# Patient Record
Sex: Female | Born: 1985 | Race: White | Hispanic: No | Marital: Single | Smoking: Current every day smoker
Health system: Southern US, Community
[De-identification: ages and names within clinical notes are randomized; demographics above are authoritative.]

## PROBLEM LIST (undated history)

## (undated) DIAGNOSIS — N189 Chronic kidney disease, unspecified: Secondary | ICD-10-CM

## (undated) DIAGNOSIS — K219 Gastro-esophageal reflux disease without esophagitis: Secondary | ICD-10-CM

## (undated) DIAGNOSIS — C801 Malignant (primary) neoplasm, unspecified: Secondary | ICD-10-CM

## (undated) HISTORY — PX: OTHER SURGICAL HISTORY: SHX169

---

## 2005-06-11 ENCOUNTER — Inpatient Hospital Stay (HOSPITAL_COMMUNITY): Admission: EM | Admit: 2005-06-11 | Discharge: 2005-06-13 | Payer: Self-pay | Admitting: Orthopedic Surgery

## 2005-10-05 ENCOUNTER — Emergency Department (HOSPITAL_COMMUNITY): Admission: EM | Admit: 2005-10-05 | Discharge: 2005-10-05 | Payer: Self-pay | Admitting: Emergency Medicine

## 2005-11-20 ENCOUNTER — Emergency Department (HOSPITAL_COMMUNITY): Admission: EM | Admit: 2005-11-20 | Discharge: 2005-11-20 | Payer: Self-pay | Admitting: Emergency Medicine

## 2010-10-14 ENCOUNTER — Inpatient Hospital Stay (HOSPITAL_COMMUNITY)
Admission: AD | Admit: 2010-10-14 | Discharge: 2010-10-14 | Disposition: A | Discharge status: Home / Self Care | Payer: BC Managed Care – PPO | Source: Ambulatory Visit | Attending: Obstetrics & Gynecology | Admitting: Obstetrics & Gynecology

## 2010-10-14 ENCOUNTER — Emergency Department (HOSPITAL_COMMUNITY)
Admission: EM | Admit: 2010-10-14 | Discharge: 2010-10-14 | Disposition: A | Discharge status: Home / Self Care | Payer: BC Managed Care – PPO | Attending: Emergency Medicine | Admitting: Emergency Medicine

## 2010-10-14 ENCOUNTER — Encounter: Payer: Self-pay | Admitting: Emergency Medicine

## 2010-10-14 ENCOUNTER — Emergency Department (HOSPITAL_COMMUNITY): Payer: BC Managed Care – PPO

## 2010-10-14 ENCOUNTER — Other Ambulatory Visit: Payer: Self-pay

## 2010-10-14 ENCOUNTER — Encounter (HOSPITAL_COMMUNITY): Payer: Self-pay

## 2010-10-14 DIAGNOSIS — O99891 Other specified diseases and conditions complicating pregnancy: Secondary | ICD-10-CM | POA: Insufficient documentation

## 2010-10-14 DIAGNOSIS — Z331 Pregnant state, incidental: Secondary | ICD-10-CM

## 2010-10-14 DIAGNOSIS — R0789 Other chest pain: Secondary | ICD-10-CM | POA: Insufficient documentation

## 2010-10-14 DIAGNOSIS — M549 Dorsalgia, unspecified: Secondary | ICD-10-CM | POA: Insufficient documentation

## 2010-10-14 DIAGNOSIS — R091 Pleurisy: Secondary | ICD-10-CM | POA: Insufficient documentation

## 2010-10-14 LAB — PREGNANCY, URINE: Preg Test, Ur: POSITIVE

## 2010-10-14 NOTE — Progress Notes (Signed)
Pt was seen at [facility] earlier today for back pain. Had a POS UPT and a BHCG of 73 and was told to come to MAU for results. Pt has a history of endometrosis and has had 2 SAB's in the past. Pt is not having any abdominal pain or bleeding.

## 2010-10-14 NOTE — ED Provider Notes (Signed)
History     No chief complaint on file.  HPIReanna C Powell25 y.o.presents for test results that were drawn at [facility].  G3 P0020.  Hx endometriosis and 2 miscarriages in first trimester.  LMP 10/07/10 that she reports as normal.  Is sexually active without contraception because she thought she was unable to become pregnant because hx endometriosis.  Denies vaginal bleeding.  Presented to [facility] for back pain. Had xray of her back that was normal.  Told she had pulled muscle.  Told she was pregnant and told to come here today for an ultrasound and to get blood test results because the  Computer was down.  She is a patient of [facility] in [place].  She plans followup there.      Past Medical History  Diagnosis Date  . Endometriosis   . Endometriosis     Past Surgical History  Procedure Date  . Laporscopic     r/o endometriosis    No family history on file.  History  Substance Use Topics  . Smoking status: Current Everyday Smoker -- 1.0 packs/day  . Smokeless tobacco: Not on file  . Alcohol Use: No    Allergies: No Known Allergies  Prescriptions prior to admission  Medication Sig Dispense Refill  . ibuprofen (ADVIL,MOTRIN) 200 MG tablet Take 800 mg by mouth every 6 (six) hours as needed. Pain         Review of Systems  Constitutional: Negative.   Gastrointestinal: Negative for nausea, vomiting and abdominal pain.  Genitourinary: Negative.   Musculoskeletal: Positive for back pain.   Physical Exam   Blood pressure 135/81, pulse 84, temperature 98.6 F (37 C), temperature source Oral, resp. rate 20, height 5' 2.5" (1.588 m), weight 206 lb 3.2 oz (93.532 kg), last menstrual period 10/03/2010.  Physical Exam  Constitutional: She appears well-developed and well-nourished. No distress.  additional examination not indicated.  Results for orders placed during the hospital encounter of 10/14/10 (from the past 24 hour(s))  PREGNANCY, URINE     Status: Normal   Collection Time   10/14/10  9:54 AM      Component Value Range   Preg Test, Ur POSITIVE    HCG, QUANTITATIVE, PREGNANCY     Status: Abnormal   Collection Time   10/14/10 11:22 AM      Component Value Range   hCG, Beta Chain, Quant, S 73 (*) <5 (mIU/mL)    MAU Course  Procedures  MDM Discussed with Dr. Marice Potter patient's hx, visit to [facility] today, lab results and plans to follow up with Dr. Francee Piccolo.  She agree with this plan.  Assessment and Plan  A: Early pregnancy  P follow up with Dr. Francee Piccolo in 48 hrs.    KEY,EVE M 10/14/2010, 4:54 PM   [name], NP 10/14/10 1719

## 2010-10-14 NOTE — ED Notes (Signed)
Pt c/o mid chest pain and upper back pain starting yesterday. Pain worsens with inspirations. Denies cough, fever. Reports family history of cardiac problems.  Patient placed on continuous cardiac monitoring, continuous pulse oximetry, and NBP cycling q 30 minutes.  EKG completed and shown to Dr. Radford Pax at 0830.  Dr. Fara Boros in to see patient.  NAD noted at this time.

## 2010-10-14 NOTE — ED Notes (Signed)
Dr. Fara Boros in to speak with pt about results. Dr. Fara Boros wants to order another blood test before discharging patient. Patient aware and is agreeable.

## 2010-10-14 NOTE — ED Notes (Signed)
Patient with no complaints at this time. Respirations even and unlabored. Skin warm/dry. Discharge instructions reviewed with patient at this time. Patient given opportunity to voice concerns/ask questions. Patient discharged at this time and left Emergency Department with steady gait.  See paper chart for further documentation.

## 2013-01-16 ENCOUNTER — Emergency Department (HOSPITAL_COMMUNITY): Payer: Self-pay

## 2013-01-16 ENCOUNTER — Other Ambulatory Visit: Payer: Self-pay

## 2013-01-16 ENCOUNTER — Encounter (HOSPITAL_COMMUNITY): Payer: Self-pay | Admitting: Emergency Medicine

## 2013-01-16 ENCOUNTER — Emergency Department (HOSPITAL_COMMUNITY)
Admission: EM | Admit: 2013-01-16 | Discharge: 2013-01-16 | Disposition: A | Discharge status: Home / Self Care | Payer: Self-pay | Attending: Emergency Medicine | Admitting: Emergency Medicine

## 2013-01-16 DIAGNOSIS — K8020 Calculus of gallbladder without cholecystitis without obstruction: Secondary | ICD-10-CM | POA: Insufficient documentation

## 2013-01-16 DIAGNOSIS — Z9889 Other specified postprocedural states: Secondary | ICD-10-CM | POA: Insufficient documentation

## 2013-01-16 DIAGNOSIS — F17200 Nicotine dependence, unspecified, uncomplicated: Secondary | ICD-10-CM | POA: Insufficient documentation

## 2013-01-16 DIAGNOSIS — Z3202 Encounter for pregnancy test, result negative: Secondary | ICD-10-CM | POA: Insufficient documentation

## 2013-01-16 DIAGNOSIS — Z8742 Personal history of other diseases of the female genital tract: Secondary | ICD-10-CM | POA: Insufficient documentation

## 2013-01-16 LAB — CBC WITH DIFFERENTIAL/PLATELET
Basophils Absolute: 0 10*3/uL (ref 0.0–0.1)
Basophils Relative: 0 % (ref 0–1)
Eosinophils Absolute: 0.4 10*3/uL (ref 0.0–0.7)
Hemoglobin: 14.6 g/dL (ref 12.0–15.0)
MCH: 31 pg (ref 26.0–34.0)
MCHC: 33.9 g/dL (ref 30.0–36.0)
Monocytes Relative: 6 % (ref 3–12)
Neutrophils Relative %: 64 % (ref 43–77)
RDW: 12.4 % (ref 11.5–15.5)

## 2013-01-16 LAB — BASIC METABOLIC PANEL
BUN: 11 mg/dL (ref 6–23)
Creatinine, Ser: 0.68 mg/dL (ref 0.50–1.10)
GFR calc Af Amer: >90 mL/min (ref >90)
GFR calc non Af Amer: >90 mL/min (ref >90)
Potassium: 4.2 mEq/L (ref 3.5–5.1)

## 2013-01-16 LAB — HEPATIC FUNCTION PANEL
Alkaline Phosphatase: 83 U/L (ref 39–117)
Total Bilirubin: 0.2 mg/dL — ABNORMAL LOW (ref 0.3–1.2)

## 2013-01-16 LAB — URINALYSIS, ROUTINE W REFLEX MICROSCOPIC
Bilirubin Urine: NEGATIVE
Hgb urine dipstick: NEGATIVE
Protein, ur: NEGATIVE mg/dL
Urobilinogen, UA: 0.2 mg/dL (ref 0.0–1.0)

## 2013-01-16 LAB — POCT PREGNANCY, URINE: Preg Test, Ur: NEGATIVE

## 2013-01-16 LAB — LIPASE, BLOOD: Lipase: 26 U/L (ref 11–59)

## 2013-01-16 MED ORDER — FENTANYL CITRATE 0.05 MG/ML IJ SOLN
INTRAMUSCULAR | Status: AC
  Administered 2013-01-16: 50 ug via INTRAVENOUS
  Filled 2013-01-16: qty 2

## 2013-01-16 MED ORDER — HYDROCODONE-ACETAMINOPHEN 5-325 MG PO TABS: 1.0000 | ORAL_TABLET | Freq: Four times a day (QID) | ORAL | Status: DC | PRN

## 2013-01-16 MED ORDER — HYDROMORPHONE HCL PF 1 MG/ML IJ SOLN
1.0000 mg | Freq: Once | INTRAMUSCULAR | Status: AC
  Administered 2013-01-16: 1 mg via INTRAVENOUS
  Filled 2013-01-16: qty 1

## 2013-01-16 MED ORDER — ONDANSETRON HCL 4 MG/2ML IJ SOLN
4.0000 mg | Freq: Once | INTRAMUSCULAR | Status: AC
  Administered 2013-01-16: 4 mg via INTRAVENOUS
  Filled 2013-01-16: qty 2

## 2013-01-16 MED ORDER — PROMETHAZINE HCL 25 MG PO TABS: 25.0000 mg | ORAL_TABLET | Freq: Four times a day (QID) | ORAL | Status: DC | PRN

## 2013-01-16 MED ORDER — PANTOPRAZOLE SODIUM 40 MG IV SOLR
40.0000 mg | Freq: Once | INTRAVENOUS | Status: AC
  Administered 2013-01-16: 40 mg via INTRAVENOUS
  Filled 2013-01-16: qty 40

## 2013-01-16 MED ORDER — FENTANYL CITRATE 0.05 MG/ML IJ SOLN
50.0000 ug | Freq: Once | INTRAMUSCULAR | Status: AC
  Administered 2013-01-16: 50 ug via INTRAVENOUS

## 2013-01-16 NOTE — ED Notes (Signed)
Patient calmer. Still states pain, but that it has improved some.

## 2013-01-16 NOTE — ED Notes (Signed)
Requesting pain medication  MD aware

## 2013-01-16 NOTE — ED Notes (Signed)
Pt reports woke up this am around 0330 with pain in middle of chest radiating through to her back.  C/O n/v.  Denies diarrhea.

## 2013-01-16 NOTE — Discharge Instructions (Signed)
Follow up with Dr. Malvin Johns or Dr. Lovell Sheehan in 2-3 weeks.  Return if problems

## 2013-01-16 NOTE — ED Notes (Signed)
Patient states that pain is now a 3 out 10.

## 2013-01-16 NOTE — ED Provider Notes (Signed)
CSN: [id number]     Arrival date & time 01/16/13  0710 History  This chart was scribed for Benny Lennert, MD by Leone Payor, ED Scribe. This patient was seen in room APA06/APA06 and the patient's care was started 8:00 AM.    Chief Complaint  Patient presents with  . Abdominal Pain    Patient is a 27 y.o. female presenting with abdominal pain. The history is provided by the patient and a relative. No language interpreter was used.  Abdominal Pain Pain location:  Epigastric Pain severity:  Moderate Duration:  4 hours Timing:  Constant Progression:  Worsening Chronicity:  Recurrent Relieved by:  None tried Worsened by:  Nothing tried Associated symptoms: nausea and vomiting   Associated symptoms: no chest pain, no cough, no diarrhea, no fatigue and no hematuria     HPI Comments: Crystal Zamora is a 27 y.o. female with past medical history of endometriosis who presents to the Emergency Department complaining of a new episode of constant, gradually worsening abdominal pain that began about 4 hours ago. She also reports having associated nausea and vomiting. She reports having a C-section in February 2014 along with a laparoscopic surgery for endometriosis. She denies diarrhea.    Past Medical History  Diagnosis Date  . Endometriosis   . Endometriosis    Past Surgical History  Procedure Laterality Date  . Laporscopic      r/o endometriosis  . Cesarean section     No family history on file. History  Substance Use Topics  . Smoking status: Current Every Day Smoker -- 1.00 packs/day  . Smokeless tobacco: Not on file  . Alcohol Use: No   OB History   Grav Para Term Preterm Abortions TAB SAB Ect Mult Living   3    2  2         Review of Systems  Constitutional: Negative for appetite change and fatigue.  HENT: Negative for congestion, ear discharge and sinus pressure.   Eyes: Negative for discharge.  Respiratory: Negative for cough.   Cardiovascular: Negative for chest  pain.  Gastrointestinal: Positive for nausea and vomiting. Negative for abdominal pain and diarrhea.  Genitourinary: Negative for frequency and hematuria.  Musculoskeletal: Negative for back pain.  Skin: Negative for rash.  Neurological: Negative for seizures and headaches.  Psychiatric/Behavioral: Negative for hallucinations.    Allergies  Review of patient's allergies indicates no known allergies.  Home Medications   Current Outpatient Rx  Name  Route  Sig  Dispense  Refill  . ALPRAZolam (XANAX) 0.5 MG tablet   Oral   Take 0.5 mg by mouth once.         Marland Kitchen ibuprofen (ADVIL,MOTRIN) 200 MG tablet   Oral   Take 400 mg by mouth every 8 (eight) hours as needed for mild pain.         . pantoprazole (PROTONIX) 40 MG tablet   Oral   Take 80 mg by mouth once.          BP 112/66  Pulse 66  Temp(Src) 98.6 F (37 C) (Oral)  Resp 18  SpO2 96%  LMP 12/26/2012  Breastfeeding? Unknown Physical Exam  Nursing note and vitals reviewed. Constitutional: She is oriented to person, place, and time. She appears well-developed.  HENT:  Head: Normocephalic.  Eyes: Conjunctivae and EOM are normal. No scleral icterus.  Neck: Neck supple. No thyromegaly present.  Cardiovascular: Normal rate, regular rhythm and normal heart sounds.  Exam reveals no gallop and no  friction rub.   No murmur heard. Pulmonary/Chest: Effort normal and breath sounds normal. No stridor. She has no wheezes. She has no rales. She exhibits no tenderness.  Abdominal: Soft. She exhibits no distension. There is tenderness ( moderate epigastric). There is no rebound.  Musculoskeletal: Normal range of motion. She exhibits no edema.  Lymphadenopathy:    She has no cervical adenopathy.  Neurological: She is oriented to person, place, and time. She exhibits normal muscle tone. Coordination normal.  Skin: No rash noted. No erythema.  Psychiatric: She has a normal mood and affect. Her behavior is normal.    ED Course   Procedures   DIAGNOSTIC STUDIES: Oxygen Saturation is 97% on RA, adequate by my interpretation.    COORDINATION OF CARE: 8:00 AM Will order UA, CBC, BMP, Troponin. Discussed treatment plan with pt at bedside and pt agreed to plan.   Labs Review Labs Reviewed  CBC WITH DIFFERENTIAL - Abnormal; Notable for the following:    WBC 10.7 (*)    All other components within normal limits  BASIC METABOLIC PANEL - Abnormal; Notable for the following:    Glucose, Bld 106 (*)    All other components within normal limits  URINALYSIS, ROUTINE W REFLEX MICROSCOPIC - Abnormal; Notable for the following:    Specific Gravity, Urine >1.030 (*)    All other components within normal limits  HEPATIC FUNCTION PANEL - Abnormal; Notable for the following:    ALT 87 (*)    Total Bilirubin 0.2 (*)    All other components within normal limits  TROPONIN I  LIPASE, BLOOD  POCT PREGNANCY, URINE   Imaging Review US Abdomen Complete  01/16/2013   CLINICAL DATA:  Abdominal pain  EXAM: ULTRASOUND ABDOMEN COMPLETE  COMPARISON:  07/25/2012  FINDINGS: Gallbladder:  There are numerous mobile shadowing gallstones. No wall thickening. No pericholecystic fluid. No evidence of acute cholecystitis.  Common bile duct:  Diameter: 3.4 mm.  No duct stone is seen.  Liver:  Liver is borderline enlarged measuring 20 cm. Liver is normal echogenicity with no mass or focal lesion. Hepatopetal flow was documented in the portal vein.  IVC:  No abnormality visualized.  Pancreas:  Visualized portion unremarkable.  Spleen:  Size and appearance within normal limits.  Right Kidney:  Length: 11.6 cm. Echogenicity within normal limits. No mass or hydronephrosis visualized.  Left Kidney:  Length: 13.0 cm. Echogenicity within normal limits. No mass or hydronephrosis visualized.  Abdominal aorta:  No aneurysm visualized.  Other findings:  None.  IMPRESSION: 1. Multiple gallstones.  No evidence of acute cholecystitis. 2. Borderline hepatomegaly. 3. No  other abnormalities.   Electronically Signed   By: Amie Portland M.D.   On: 01/16/2013 10:24   Dg Abd Acute W/chest  01/16/2013   CLINICAL DATA:  Pain and nausea  EXAM: ACUTE ABDOMEN SERIES (ABDOMEN 2 VIEW & CHEST 1 VIEW)  COMPARISON:  CT abdomen and pelvis July 25, 2012  FINDINGS: PA chest: Lungs are clear. Heart is upper normal in size with normal pulmonary vascularity. No adenopathy.  Supine and upright abdomen: The bowel gas pattern is normal. No obstruction or free air. There is moderate diffuse stool throughout colon. There are apparent phleboliths in the pelvis.  IMPRESSION: Moderate stool throughout colon somewhat diffusely. Bowel gas pattern unremarkable. No edema or consolidation.   Electronically Signed   By: Bretta Bang M.D.   On: 01/16/2013 09:27    EKG Interpretation   None       MDM  The chart was scribed for me under my direct supervision.  I personally performed the history, physical, and medical decision making and all procedures in the evaluation of this patient.Benny Lennert, MD 01/16/13 [phone]

## 2013-01-16 NOTE — ED Notes (Addendum)
Pt is aware urine sample is needed. 

## 2013-01-16 NOTE — ED Notes (Signed)
Patient moaning, in fetal position on bed. Unable to lay flat or still. Medicated per pain protocol.

## 2013-01-16 NOTE — ED Notes (Signed)
Patient with no complaints at this time. Respirations even and unlabored. Skin warm/dry. Discharge instructions reviewed with patient at this time. Patient given opportunity to voice concerns/ask questions. IV removed per policy and band-aid applied to site. Patient discharged at this time and left Emergency Department with steady gait.  

## 2013-02-14 ENCOUNTER — Emergency Department (HOSPITAL_COMMUNITY)
Admission: EM | Admit: 2013-02-14 | Discharge: 2013-02-15 | Disposition: A | Discharge status: Home / Self Care | Payer: PRIVATE HEALTH INSURANCE | Attending: Emergency Medicine | Admitting: Emergency Medicine

## 2013-02-14 ENCOUNTER — Encounter (HOSPITAL_COMMUNITY): Payer: Self-pay | Admitting: Emergency Medicine

## 2013-02-14 DIAGNOSIS — K8020 Calculus of gallbladder without cholecystitis without obstruction: Secondary | ICD-10-CM | POA: Insufficient documentation

## 2013-02-14 DIAGNOSIS — Z8742 Personal history of other diseases of the female genital tract: Secondary | ICD-10-CM | POA: Insufficient documentation

## 2013-02-14 DIAGNOSIS — Z3202 Encounter for pregnancy test, result negative: Secondary | ICD-10-CM | POA: Insufficient documentation

## 2013-02-14 DIAGNOSIS — Z79899 Other long term (current) drug therapy: Secondary | ICD-10-CM | POA: Insufficient documentation

## 2013-02-14 DIAGNOSIS — F17200 Nicotine dependence, unspecified, uncomplicated: Secondary | ICD-10-CM | POA: Insufficient documentation

## 2013-02-14 DIAGNOSIS — Z9889 Other specified postprocedural states: Secondary | ICD-10-CM | POA: Insufficient documentation

## 2013-02-14 DIAGNOSIS — R1013 Epigastric pain: Secondary | ICD-10-CM

## 2013-02-14 LAB — COMPREHENSIVE METABOLIC PANEL
ALT: 10 U/L (ref 0–35)
AST: 13 U/L (ref 0–37)
Albumin: 3.8 g/dL (ref 3.5–5.2)
Alkaline Phosphatase: 74 U/L (ref 39–117)
BUN: 13 mg/dL (ref 6–23)
CALCIUM: 9.2 mg/dL (ref 8.4–10.5)
CO2: 24 mEq/L (ref 19–32)
CREATININE: 0.68 mg/dL (ref 0.50–1.10)
Chloride: 104 mEq/L (ref 96–112)
GLUCOSE: 87 mg/dL (ref 70–99)
Potassium: 4.2 mEq/L (ref 3.7–5.3)
Sodium: 140 mEq/L (ref 137–147)
Total Bilirubin: 0.2 mg/dL — ABNORMAL LOW (ref 0.3–1.2)
Total Protein: 7 g/dL (ref 6.0–8.3)

## 2013-02-14 LAB — CBC WITH DIFFERENTIAL/PLATELET
Basophils Absolute: 0.1 10*3/uL (ref 0.0–0.1)
Basophils Relative: 1 % (ref 0–1)
EOS PCT: 3 % (ref 0–5)
Eosinophils Absolute: 0.4 10*3/uL (ref 0.0–0.7)
HCT: 41.9 % (ref 36.0–46.0)
Hemoglobin: 14.3 g/dL (ref 12.0–15.0)
LYMPHS ABS: 3.9 10*3/uL (ref 0.7–4.0)
LYMPHS PCT: 32 % (ref 12–46)
MCH: 30.8 pg (ref 26.0–34.0)
MCHC: 34.1 g/dL (ref 30.0–36.0)
MCV: 90.1 fL (ref 78.0–100.0)
MONOS PCT: 7 % (ref 3–12)
Monocytes Absolute: 0.8 10*3/uL (ref 0.1–1.0)
Neutro Abs: 7.1 10*3/uL (ref 1.7–7.7)
Neutrophils Relative %: 57 % (ref 43–77)
Platelets: 249 10*3/uL (ref 150–400)
RBC: 4.65 MIL/uL (ref 3.87–5.11)
RDW: 12.5 % (ref 11.5–15.5)
WBC: 12.2 10*3/uL — AB (ref 4.0–10.5)

## 2013-02-14 LAB — LIPASE, BLOOD: Lipase: 26 U/L (ref 11–59)

## 2013-02-14 MED ORDER — HYDROMORPHONE HCL PF 1 MG/ML IJ SOLN
INTRAMUSCULAR | Status: AC
  Filled 2013-02-14: qty 1

## 2013-02-14 MED ORDER — FENTANYL CITRATE 0.05 MG/ML IJ SOLN
100.0000 ug | Freq: Once | INTRAMUSCULAR | Status: AC
  Administered 2013-02-14: 100 ug via INTRAVENOUS

## 2013-02-14 MED ORDER — FENTANYL CITRATE 0.05 MG/ML IJ SOLN
INTRAMUSCULAR | Status: DC
Start: 2013-02-14 — End: 2013-02-15
  Filled 2013-02-14: qty 2

## 2013-02-14 MED ORDER — HYDROMORPHONE HCL PF 1 MG/ML IJ SOLN
1.0000 mg | Freq: Once | INTRAMUSCULAR | Status: AC
  Administered 2013-02-14: 1 mg via INTRAVENOUS

## 2013-02-14 MED ORDER — ONDANSETRON HCL 4 MG/2ML IJ SOLN
4.0000 mg | Freq: Once | INTRAMUSCULAR | Status: AC
  Administered 2013-02-14: 4 mg via INTRAVENOUS
  Filled 2013-02-14: qty 2

## 2013-02-14 MED ORDER — SODIUM CHLORIDE 0.9 % IV SOLN
1000.0000 mL | Freq: Once | INTRAVENOUS | Status: AC
  Administered 2013-02-14: 1000 mL via INTRAVENOUS

## 2013-02-14 NOTE — ED Notes (Signed)
Sudden onset mid epigastric pain radiating to mid back with vomiting

## 2013-02-14 NOTE — ED Provider Notes (Addendum)
CSN: [id number]     Arrival date & time 02/14/13  2226 History  This chart was scribed for Wynetta Fines, MD by Eugenia Mcalpine, ED Scribe. This patient was seen in room APA17/APA17 and the patient's care was started at 11:38 PM.      Chief Complaint  Patient presents with  . Abdominal Pain   (Consider location/radiation/quality/duration/timing/severity/associated sxs/prior Treatment) HPI HPI Comments: Crystal Zamora is a 28 y.o. female with a hx of gallstones who presents to the Emergency Department complaining of sudden onset 10/10 epigastric abdominal pain that radiates to her right shoulder that started around 7:30  after she ate pasta and cream sauce. There has been associated nausea and 3 episodes of vomiting. She denies trouble breathing or diarrhea.  Past Medical History  Diagnosis Date  . Endometriosis   . Endometriosis    Past Surgical History  Procedure Laterality Date  . Laporscopic      r/o endometriosis  . Cesarean section     No family history on file. History  Substance Use Topics  . Smoking status: Current Every Day Smoker -- 1.00 packs/day  . Smokeless tobacco: Not on file  . Alcohol Use: No   OB History   Grav Para Term Preterm Abortions TAB SAB Ect Mult Living   3    2  2         Review of Systems 10 Systems reviewed and are negative for acute change except as noted in the HPI.  Allergies  Review of patient's allergies indicates no known allergies.  Home Medications   Current Outpatient Rx  Name  Route  Sig  Dispense  Refill  . ALPRAZolam (XANAX) 0.5 MG tablet   Oral   Take 0.5 mg by mouth once.         Marland Kitchen HYDROcodone-acetaminophen (NORCO/VICODIN) 5-325 MG per tablet   Oral   Take 1 tablet by mouth every 6 (six) hours as needed for moderate pain.   40 tablet   0   . ibuprofen (ADVIL,MOTRIN) 200 MG tablet   Oral   Take 400 mg by mouth every 8 (eight) hours as needed for mild pain.         . pantoprazole (PROTONIX) 40 MG tablet   Oral    Take 80 mg by mouth once.         . promethazine (PHENERGAN) 25 MG tablet   Oral   Take 1 tablet (25 mg total) by mouth every 6 (six) hours as needed for nausea or vomiting.   30 tablet   0    BP 121/57  Pulse 58  Temp(Src) 97.7 F (36.5 C) (Oral)  Resp 14  Ht 5\' 6"  (1.676 m)  Wt 200 lb (90.719 kg)  BMI 32.30 kg/m2  SpO2 99%  LMP 01/26/2013  Physical Exam General: Well-developed, well-nourished female in no acute distress; appearance consistent with age of record HENT: normocephalic; atraumatic Eyes: pupils equal, round and reactive to light; extraocular muscles intact Neck: supple Heart: regular rate and rhythm; no murmurs, rubs or gallops; pulses normal Lungs: clear to auscultation bilaterally Abdomen: soft; nondistended; no masses or hepatosplenomegaly; bowel sounds present; mild epigastric tenderness Extremities: No deformity; full range of motion; pulses normal; no edema Neurologic: Awake, alert and oriented; motor function intact in all extremities and symmetric; no facial droop Skin: Warm and dry Psychiatric: Normal mood and affect  ED Course  Procedures (including critical care time)  DIAGNOSTIC STUDIES: Oxygen Saturation is 97% on RA, adequate by my  interpretation.    COORDINATION OF CARE: 11:41 PM- Pt advised of plan for treatment and pt agrees.   MDM   Nursing notes and vitals signs, including pulse oximetry, reviewed.  Summary of this visit's results, reviewed by myself:  Labs:  Results for orders placed during the hospital encounter of 02/14/13 (from the past 24 hour(s))  CBC WITH DIFFERENTIAL     Status: Abnormal   Collection Time    02/14/13 11:12 PM      Result Value Range   WBC 12.2 (*) 4.0 - 10.5 K/uL   RBC 4.65  3.87 - 5.11 MIL/uL   Hemoglobin 14.3  12.0 - 15.0 g/dL   HCT 41.9  36.0 - 46.0 %   MCV 90.1  78.0 - 100.0 fL   MCH 30.8  26.0 - 34.0 pg   MCHC 34.1  30.0 - 36.0 g/dL   RDW 12.5  11.5 - 15.5 %   Platelets 249  150 - 400 K/uL    Neutrophils Relative % 57  43 - 77 %   Neutro Abs 7.1  1.7 - 7.7 K/uL   Lymphocytes Relative 32  12 - 46 %   Lymphs Abs 3.9  0.7 - 4.0 K/uL   Monocytes Relative 7  3 - 12 %   Monocytes Absolute 0.8  0.1 - 1.0 K/uL   Eosinophils Relative 3  0 - 5 %   Eosinophils Absolute 0.4  0.0 - 0.7 K/uL   Basophils Relative 1  0 - 1 %   Basophils Absolute 0.1  0.0 - 0.1 K/uL  COMPREHENSIVE METABOLIC PANEL     Status: Abnormal   Collection Time    02/14/13 11:12 PM      Result Value Range   Sodium 140  137 - 147 mEq/L   Potassium 4.2  3.7 - 5.3 mEq/L   Chloride 104  96 - 112 mEq/L   CO2 24  19 - 32 mEq/L   Glucose, Bld 87  70 - 99 mg/dL   BUN 13  6 - 23 mg/dL   Creatinine, Ser 0.68  0.50 - 1.10 mg/dL   Calcium 9.2  8.4 - 10.5 mg/dL   Total Protein 7.0  6.0 - 8.3 g/dL   Albumin 3.8  3.5 - 5.2 g/dL   AST 13  0 - 37 U/L   ALT 10  0 - 35 U/L   Alkaline Phosphatase 74  39 - 117 U/L   Total Bilirubin 0.2 (*) 0.3 - 1.2 mg/dL   GFR calc non Af Amer >90  >90 mL/min   GFR calc Af Amer >90  >90 mL/min  LIPASE, BLOOD     Status: None   Collection Time    02/14/13 11:12 PM      Result Value Range   Lipase 26  11 - 59 U/L  POCT PREGNANCY, URINE     Status: None   Collection Time    03/16/2013 12:38 AM      Result Value Range   Preg Test, Ur NEGATIVE  NEGATIVE    Imaging Studies: US Abdomen Limited Ruq  March 16, 2013   CLINICAL DATA:  Upper abdominal pain  EXAM: US ABDOMEN LIMITED - RIGHT UPPER QUADRANT  COMPARISON:  January 16, 2013  FINDINGS: Gallbladder:  Within the gallbladder, there are multiple echogenic foci which move and shadow consistent with gallstones. There is no gallbladder wall thickening or pericholecystic fluid collection. No sonographic Murphy sign noted.  Common bile duct:  Diameter: 5 mm. There is  no intrahepatic or extrahepatic biliary duct dilatation.  Liver:  No focal lesion identified. Within normal limits in parenchymal echogenicity.  IMPRESSION: Cholelithiasis.  Study otherwise  unremarkable.   Electronically Signed   By: Lowella Grip M.D.   On: 02/15/2013 07:35   7:44 AM The patient is still having some epigastric tenderness but had a negative sonographic Murphy's sign and no evidence of cholecystitis on ultrasound. There is no evidence of biliary obstruction or pancreatitis on lab work. She may have acute gastritis I will start her on omeprazole and Carafate. We will refer her to the general surgeon for consideration of elective cholecystectomy.   I personally performed the services described in this documentation, which was scribed in my presence.  The recorded information has been reviewed and considered.   Wynetta Fines, MD 02/15/13 [place], MD 02/15/13 [phone]

## 2013-02-15 ENCOUNTER — Emergency Department (HOSPITAL_COMMUNITY): Payer: PRIVATE HEALTH INSURANCE

## 2013-02-15 LAB — POCT PREGNANCY, URINE: Preg Test, Ur: NEGATIVE

## 2013-02-15 MED ORDER — HYDROMORPHONE HCL PF 1 MG/ML IJ SOLN
1.0000 mg | Freq: Once | INTRAMUSCULAR | Status: AC
  Administered 2013-02-15: 1 mg via INTRAVENOUS

## 2013-02-15 MED ORDER — HYDROCODONE-ACETAMINOPHEN 5-325 MG PO TABS: 1.0000 | ORAL_TABLET | Freq: Four times a day (QID) | ORAL | Status: DC | PRN

## 2013-02-15 MED ORDER — PANTOPRAZOLE SODIUM 40 MG IV SOLR
40.0000 mg | Freq: Once | INTRAVENOUS | Status: AC
  Administered 2013-02-15: 40 mg via INTRAVENOUS

## 2013-02-15 MED ORDER — HYDROMORPHONE HCL PF 1 MG/ML IJ SOLN
INTRAMUSCULAR | Status: AC
  Filled 2013-02-15: qty 1

## 2013-02-15 MED ORDER — HYDROMORPHONE HCL PF 1 MG/ML IJ SOLN
1.0000 mg | Freq: Once | INTRAMUSCULAR | Status: AC
  Administered 2013-02-15: 1 mg via INTRAVENOUS
  Filled 2013-02-15: qty 1

## 2013-02-15 MED ORDER — SUCRALFATE 1 G PO TABS: 1.0000 g | ORAL_TABLET | Freq: Three times a day (TID) | ORAL | Status: DC

## 2013-02-15 MED ORDER — SUCRALFATE 1 G PO TABS
1.0000 g | ORAL_TABLET | Freq: Three times a day (TID) | ORAL | Status: DC
  Administered 2013-02-15: 1 g via ORAL
  Filled 2013-02-15 (×6): qty 1

## 2013-02-15 MED ORDER — ONDANSETRON HCL 4 MG/2ML IJ SOLN
4.0000 mg | Freq: Once | INTRAMUSCULAR | Status: AC
  Administered 2013-02-15: 4 mg via INTRAVENOUS
  Filled 2013-02-15: qty 2

## 2013-02-15 MED ORDER — ONDANSETRON 4 MG PO TBDP: 4.0000 mg | ORAL_TABLET | Freq: Three times a day (TID) | ORAL | Status: DC | PRN

## 2013-02-15 MED ORDER — OMEPRAZOLE 40 MG PO CPDR: 40.0000 mg | DELAYED_RELEASE_CAPSULE | Freq: Every day | ORAL | Status: DC

## 2013-02-15 MED ORDER — PANTOPRAZOLE SODIUM 40 MG IV SOLR
INTRAVENOUS | Status: AC
  Filled 2013-02-15: qty 40

## 2013-02-21 ENCOUNTER — Encounter (HOSPITAL_COMMUNITY): Payer: Self-pay | Admitting: Pharmacy Technician

## 2013-02-21 NOTE — Patient Instructions (Signed)
Crystal Zamora  02/21/2013   Your procedure is scheduled on:  02/24/2013  Report to [facility] at 1015  AM.  Call this number if you have problems the morning of surgery: [phone]   Remember:   Do not eat food or drink liquids after midnight.   Take these medicines the morning of surgery with A SIP OF WATER:  Xanax, hydrocodone, omeprazole, zofran, pantoprazole, phenergan   Do not wear jewelry, make-up or nail polish.  Do not wear lotions, powders, or perfumes.   Do not shave 48 hours prior to surgery. Men may shave face and neck.  Do not bring valuables to the hospital.  [facility] is not responsible for any belongings or valuables.               Contacts, dentures or bridgework may not be worn into surgery.  Leave suitcase in the car. After surgery it may be brought to your room.  For patients admitted to the hospital, discharge time is determined by your treatment team.               Patients discharged the day of surgery will not be allowed to drive home.  Name and phone number of your driver: family  Special Instructions: Shower using CHG 2 nights before surgery and the night before surgery.  If you shower the day of surgery use CHG.  Use special wash - you have one bottle of CHG for all showers.  You should use approximately 1/3 of the bottle for each shower.   Please read over the following fact sheets that you were given: Pain Booklet, Coughing and Deep Breathing, Surgical Site Infection Prevention, Anesthesia Post-op Instructions and Care and Recovery After Surgery Laparoscopic Cholecystectomy Laparoscopic cholecystectomy is surgery to remove the gallbladder. The gallbladder is located in the upper right part of the abdomen, behind the liver. It is a storage sac for bile produced in the liver. Bile aids in the digestion and absorption of fats. Cholecystectomy is often done for inflammation of the gallbladder (cholecystitis). This condition is usually caused by a  buildup of gallstones (cholelithiasis) in your gallbladder. Gallstones can block the flow of bile, resulting in inflammation and pain. In severe cases, emergency surgery may be required. When emergency surgery is not required, you will have time to prepare for the procedure. Laparoscopic surgery is an alternative to open surgery. Laparoscopic surgery has a shorter recovery time. Your common bile duct may also need to be examined during the procedure. If stones are found in the common bile duct, they may be removed. LET [facility] CARE PROVIDER KNOW ABOUT:  Any allergies you have.  All medicines you are taking, including vitamins, herbs, eye drops, creams, and over-the-counter medicines.  Previous problems you or members of your family have had with the use of anesthetics.  Any blood disorders you have.  Previous surgeries you have had.  Medical conditions you have. RISKS AND COMPLICATIONS Generally, this is a safe procedure. However, as with any procedure, complications can occur. Possible complications include:  Infection.  Damage to the common bile duct, nerves, arteries, veins, or other internal organs such as the stomach, liver, or intestines.  Bleeding.  A stone may remain in the common bile duct.  A bile leak from the cyst duct that is clipped when your gallbladder is removed.  The need to convert to open surgery, which requires a larger incision in the abdomen. This may be necessary if your  surgeon thinks it is not safe to continue with a laparoscopic procedure. BEFORE THE PROCEDURE  Ask your health care provider about changing or stopping any regular medicines. You will need to stop taking aspirin or blood thinners at least 5 days prior to surgery.  Do not eat or drink anything after midnight the night before surgery.  Let your health care provider know if you develop a cold or other infectious problem before surgery. PROCEDURE   You will be given medicine to make you  sleep through the procedure (general anesthetic). A breathing tube will be placed in your mouth.  When you are asleep, your surgeon will make several small cuts (incisions) in your abdomen.  A thin, lighted tube with a tiny camera on the end (laparoscope) is inserted through one of the small incisions. The camera on the laparoscope sends a picture to a TV screen in the operating room. This gives the surgeon a good view inside your abdomen.  A gas will be pumped into your abdomen. This expands your abdomen so that the surgeon has more room to perform the surgery.  Other tools needed for the procedure are inserted through the other incisions. The gallbladder is removed through one of the incisions.  After the removal of your gallbladder, the incisions will be closed with stitches, staples, or skin glue. AFTER THE PROCEDURE  You will be taken to a recovery area where your progress will be checked often.  You may be allowed to go home the same day if your pain is controlled and you can tolerate liquids. Document Released: 01/12/2005 Document Revised: 11/02/2012 Document Reviewed: 08/24/2012 [facility] Patient Information 2014 [place]. PATIENT INSTRUCTIONS POST-ANESTHESIA  IMMEDIATELY FOLLOWING SURGERY:  Do not drive or operate machinery for the first twenty four hours after surgery.  Do not make any important decisions for twenty four hours after surgery or while taking narcotic pain medications or sedatives.  If you develop intractable nausea and vomiting or a severe headache please notify your doctor immediately.  FOLLOW-UP:  Please make an appointment with your surgeon as instructed. You do not need to follow up with anesthesia unless specifically instructed to do so.  WOUND CARE INSTRUCTIONS (if applicable):  Keep a dry clean dressing on the anesthesia/puncture wound site if there is drainage.  Once the wound has quit draining you may leave it open to air.  Generally you should leave  the bandage intact for twenty four hours unless there is drainage.  If the epidural site drains for more than 36-48 hours please call the anesthesia department.  QUESTIONS?:  Please feel free to call your physician or the hospital operator if you have any questions, and they will be happy to assist you.

## 2013-02-21 NOTE — H&P (Signed)
  NTS SOAP Note  Vital Signs:  Vitals as of: 03/19/9796: Systolic 921: Diastolic 76: Heart Rate 62: Temp 96.31F: Height 15ft 6in: Weight 228Lbs 0 Ounces: Pain Level 8: BMI 36.8  BMI : 36.8 kg/m2  Subjective: This 28 Years 75 Months old Female presents for of    ABDOMINAL PAIN : ,Has been having right upper quadrant abdominal pain, nausea, and fatty food intolerance.  Seen in ER 1/20.  U/S of gallbladder shows cholelithiasis, normal common bile duct. No fever, chills, jaundice.  Review of Symptoms:  Constitutional:  fatigue,weakness    headache Eyes:unremarkable   Nose/Mouth/Throat:unremarkable Cardiovascular:  unremarkable   Respiratory:unremarkable   Gastrointestin    abdominal pain,nausea,vomiting,heartburn Genitourinary:unremarkable     Musculoskeletal:unremarkable   Skin:unremarkable Breast:   Hematolgic/Lymphatic:unremarkable     Allergic/Immunologic:unremarkable     Past Medical History:    Reviewed  Past Medical History  Surgical History: diagnostic laparoscopy Medical Problems: reflux Allergies: nkda Medications: zofran, sucralfate, prilosec   Social History:Reviewed  Social History  Preferred Language: English Race:  White Ethnicity: Not Hispanic / Latino Age: 28 Years 9 Months Marital Status:  S Alcohol:  Yes   Smoking Status: Light tobacco smoker reviewed on 02/21/2013 Started Date: 01/27/2003 Packs per day: 0.50 Functional Status reviewed on 02/21/2013 ------------------------------------------------ Bathing: Normal Cooking: Normal Dressing: Normal Driving: Normal Eating: Normal Managing Meds: Normal Oral Care: Normal Shopping: Normal Toileting: Normal Transferring: Normal Walking: Normal Cognitive Status reviewed on 02/21/2013 ------------------------------------------------ Attention: Normal Decision Making: Normal Language: Normal Memory: Normal Motor: Normal Perception: Normal Problem  Solving: Normal Visual and Spatial: Normal   Family History:  Reviewed  Family Health History Mother, Living; Healthy; healthy Father, Living; Heart disease;     Objective Information: General:  Well appearing, well nourished in no distress.   no scleral icterus Heart:  RRR, no murmur Lungs:    CTA bilaterally, no wheezes, rhonchi, rales.  Breathing unlabored. Abdomen:Soft, tender in right upper quadrant to palpation, /ND, normal bowel sounds, no HSM, no masses.  No peritoneal signs.    ER visit/records reviewed.  Assessment:Biliary colic, cholelithiasis  Diagnoses: 574.00 Calculus of gallbladder with acute cholecystitis (Calculus of gallbladder with acute cholecystitis without obstruction)  Procedures: 19417 - OFFICE OUTPATIENT NEW 30 MINUTES    Plan:Scheduled for laparoscopic cholecystectomy on 02/24/13.   Patient Education:Alternative treatments to surgery were discussed with patient (and family).  Risks and benefits  of procedure including bleeding, infection, hepatobiliary injury, and the possibility of an open procedure were fully explained to the patient (and family) who gave informed consent. Patient/family questions were addressed.  Follow-up:Pending Surgery

## 2013-02-22 ENCOUNTER — Encounter (HOSPITAL_COMMUNITY): Payer: Self-pay

## 2013-02-22 ENCOUNTER — Encounter (HOSPITAL_COMMUNITY)
Admission: RE | Admit: 2013-02-22 | Discharge: 2013-02-22 | Disposition: A | Discharge status: Home / Self Care | Payer: BC Managed Care – PPO | Source: Ambulatory Visit

## 2013-02-22 HISTORY — DX: Gastro-esophageal reflux disease without esophagitis: K21.9

## 2013-02-24 ENCOUNTER — Encounter (HOSPITAL_COMMUNITY): Payer: Self-pay | Admitting: *Deleted

## 2013-02-24 ENCOUNTER — Ambulatory Visit (HOSPITAL_COMMUNITY)
Admission: RE | Admit: 2013-02-24 | Discharge: 2013-02-24 | Disposition: A | Discharge status: Home / Self Care | Payer: PRIVATE HEALTH INSURANCE | Source: Ambulatory Visit | Attending: General Surgery | Admitting: General Surgery

## 2013-02-24 ENCOUNTER — Encounter (HOSPITAL_COMMUNITY)
Admission: RE | Disposition: A | Discharge status: Home / Self Care | Payer: Self-pay | Source: Ambulatory Visit | Attending: General Surgery

## 2013-02-24 ENCOUNTER — Ambulatory Visit (HOSPITAL_COMMUNITY): Payer: PRIVATE HEALTH INSURANCE | Admitting: Anesthesiology

## 2013-02-24 ENCOUNTER — Encounter (HOSPITAL_COMMUNITY): Payer: PRIVATE HEALTH INSURANCE | Admitting: Anesthesiology

## 2013-02-24 DIAGNOSIS — F17200 Nicotine dependence, unspecified, uncomplicated: Secondary | ICD-10-CM | POA: Insufficient documentation

## 2013-02-24 DIAGNOSIS — K8010 Calculus of gallbladder with chronic cholecystitis without obstruction: Secondary | ICD-10-CM | POA: Insufficient documentation

## 2013-02-24 HISTORY — DX: Malignant (primary) neoplasm, unspecified: C80.1

## 2013-02-24 HISTORY — DX: Chronic kidney disease, unspecified: N18.9

## 2013-02-24 HISTORY — PX: CHOLECYSTECTOMY: SHX55

## 2013-02-24 SURGERY — LAPAROSCOPIC CHOLECYSTECTOMY
Anesthesia: General | Site: Abdomen

## 2013-02-24 MED ORDER — MIDAZOLAM HCL 2 MG/2ML IJ SOLN
INTRAMUSCULAR | Status: AC
  Filled 2013-02-24: qty 2

## 2013-02-24 MED ORDER — BUPIVACAINE HCL (PF) 0.5 % IJ SOLN
INTRAMUSCULAR | Status: DC | PRN
  Administered 2013-02-24: 10 mL

## 2013-02-24 MED ORDER — HYDROCODONE-ACETAMINOPHEN 5-325 MG PO TABS: 1.0000 | ORAL_TABLET | ORAL | Status: DC | PRN

## 2013-02-24 MED ORDER — FENTANYL CITRATE 0.05 MG/ML IJ SOLN
INTRAMUSCULAR | Status: AC
  Filled 2013-02-24: qty 2

## 2013-02-24 MED ORDER — FENTANYL CITRATE 0.05 MG/ML IJ SOLN: 25.0000 ug | INTRAMUSCULAR | Status: DC | PRN

## 2013-02-24 MED ORDER — FENTANYL CITRATE 0.05 MG/ML IJ SOLN
25.0000 ug | INTRAMUSCULAR | Status: AC
  Administered 2013-02-24 (×2): 25 ug via INTRAVENOUS

## 2013-02-24 MED ORDER — GLYCOPYRROLATE 0.2 MG/ML IJ SOLN
0.2000 mg | Freq: Once | INTRAMUSCULAR | Status: AC
  Administered 2013-02-24: 0.2 mg via INTRAVENOUS

## 2013-02-24 MED ORDER — ONDANSETRON HCL 4 MG/2ML IJ SOLN: 4.0000 mg | Freq: Once | INTRAMUSCULAR | Status: DC | PRN

## 2013-02-24 MED ORDER — LIDOCAINE HCL 1 % IJ SOLN
INTRAMUSCULAR | Status: DC | PRN
  Administered 2013-02-24: 50 mg via INTRADERMAL

## 2013-02-24 MED ORDER — POVIDONE-IODINE 10 % EX OINT
TOPICAL_OINTMENT | CUTANEOUS | Status: AC
  Filled 2013-02-24: qty 2

## 2013-02-24 MED ORDER — CLINDAMYCIN PHOSPHATE 900 MG/50ML IV SOLN
INTRAVENOUS | Status: AC
  Filled 2013-02-24: qty 50

## 2013-02-24 MED ORDER — SODIUM CHLORIDE 0.9 % IR SOLN
Status: DC | PRN
  Administered 2013-02-24: 1000 mL

## 2013-02-24 MED ORDER — KETOROLAC TROMETHAMINE 30 MG/ML IJ SOLN
30.0000 mg | Freq: Once | INTRAMUSCULAR | Status: AC
  Administered 2013-02-24: 30 mg via INTRAVENOUS
  Filled 2013-02-24: qty 1

## 2013-02-24 MED ORDER — DEXAMETHASONE SODIUM PHOSPHATE 4 MG/ML IJ SOLN
4.0000 mg | Freq: Once | INTRAMUSCULAR | Status: AC
  Administered 2013-02-24: 4 mg via INTRAVENOUS

## 2013-02-24 MED ORDER — ONDANSETRON HCL 4 MG/2ML IJ SOLN
INTRAMUSCULAR | Status: AC
  Filled 2013-02-24: qty 2

## 2013-02-24 MED ORDER — ONDANSETRON HCL 4 MG/2ML IJ SOLN
4.0000 mg | Freq: Once | INTRAMUSCULAR | Status: AC
  Administered 2013-02-24: 4 mg via INTRAVENOUS

## 2013-02-24 MED ORDER — LACTATED RINGERS IV SOLN
INTRAVENOUS | Status: DC
  Administered 2013-02-24 (×2): via INTRAVENOUS

## 2013-02-24 MED ORDER — BUPIVACAINE HCL (PF) 0.5 % IJ SOLN
INTRAMUSCULAR | Status: AC
Start: 2013-02-24 — End: 2013-02-24
  Filled 2013-02-24: qty 30

## 2013-02-24 MED ORDER — HEMOSTATIC AGENTS (NO CHARGE) OPTIME
TOPICAL | Status: DC | PRN
  Administered 2013-02-24: 1 via TOPICAL

## 2013-02-24 MED ORDER — SUCCINYLCHOLINE CHLORIDE 20 MG/ML IJ SOLN
INTRAMUSCULAR | Status: AC
  Filled 2013-02-24: qty 1

## 2013-02-24 MED ORDER — GLYCOPYRROLATE 0.2 MG/ML IJ SOLN
INTRAMUSCULAR | Status: DC | PRN
  Administered 2013-02-24: .6 mg via INTRAVENOUS

## 2013-02-24 MED ORDER — DEXAMETHASONE SODIUM PHOSPHATE 4 MG/ML IJ SOLN
INTRAMUSCULAR | Status: AC
  Filled 2013-02-24: qty 1

## 2013-02-24 MED ORDER — ROCURONIUM BROMIDE 50 MG/5ML IV SOLN
INTRAVENOUS | Status: AC
  Filled 2013-02-24: qty 1

## 2013-02-24 MED ORDER — FENTANYL CITRATE 0.05 MG/ML IJ SOLN
INTRAMUSCULAR | Status: AC
Start: 2013-02-24 — End: 2013-02-24
  Filled 2013-02-24: qty 5

## 2013-02-24 MED ORDER — MIDAZOLAM HCL 2 MG/2ML IJ SOLN
1.0000 mg | INTRAMUSCULAR | Status: AC | PRN
  Administered 2013-02-24 (×3): 2 mg via INTRAVENOUS

## 2013-02-24 MED ORDER — PROPOFOL 10 MG/ML IV BOLUS
INTRAVENOUS | Status: DC | PRN
  Administered 2013-02-24: 200 mg via INTRAVENOUS

## 2013-02-24 MED ORDER — NEOSTIGMINE METHYLSULFATE 1 MG/ML IJ SOLN
INTRAMUSCULAR | Status: DC | PRN
  Administered 2013-02-24: 4 mg via INTRAVENOUS

## 2013-02-24 MED ORDER — FENTANYL CITRATE 0.05 MG/ML IJ SOLN
INTRAMUSCULAR | Status: DC | PRN
  Administered 2013-02-24 (×6): 50 ug via INTRAVENOUS
  Administered 2013-02-24: 100 ug via INTRAVENOUS

## 2013-02-24 MED ORDER — GLYCOPYRROLATE 0.2 MG/ML IJ SOLN
INTRAMUSCULAR | Status: AC
  Filled 2013-02-24: qty 2

## 2013-02-24 MED ORDER — CLINDAMYCIN PHOSPHATE 900 MG/50ML IV SOLN
900.0000 mg | Freq: Once | INTRAVENOUS | Status: DC
  Administered 2013-02-24: 900 mg via INTRAVENOUS

## 2013-02-24 MED ORDER — CHLORHEXIDINE GLUCONATE 4 % EX LIQD: 1.0000 "application " | Freq: Once | CUTANEOUS | Status: DC

## 2013-02-24 MED ORDER — SUCCINYLCHOLINE CHLORIDE 20 MG/ML IJ SOLN
INTRAMUSCULAR | Status: DC | PRN
  Administered 2013-02-24: 120 mg via INTRAVENOUS

## 2013-02-24 MED ORDER — ROCURONIUM BROMIDE 100 MG/10ML IV SOLN
INTRAVENOUS | Status: DC | PRN
  Administered 2013-02-24: 30 mg via INTRAVENOUS

## 2013-02-24 MED ORDER — POVIDONE-IODINE 10 % OINT PACKET
TOPICAL_OINTMENT | CUTANEOUS | Status: DC | PRN
  Administered 2013-02-24: 2 via TOPICAL

## 2013-02-24 SURGICAL SUPPLY — 45 items
APPLIER CLIP LAPSCP 10X32 DD (CLIP) ×3 IMPLANT
BAG HAMPER (MISCELLANEOUS) ×3 IMPLANT
BAG SPEC RTRVL LRG 6X4 10 (ENDOMECHANICALS) ×1
CLOTH BEACON ORANGE TIMEOUT ST (SAFETY) ×3 IMPLANT
COVER LIGHT HANDLE STERIS (MISCELLANEOUS) ×6 IMPLANT
DECANTER SPIKE VIAL GLASS SM (MISCELLANEOUS) ×3 IMPLANT
DURAPREP 26ML APPLICATOR (WOUND CARE) ×3 IMPLANT
ELECT REM PT RETURN 9FT ADLT (ELECTROSURGICAL) ×3
ELECTRODE REM PT RTRN 9FT ADLT (ELECTROSURGICAL) ×1 IMPLANT
FILTER SMOKE EVAC LAPAROSHD (FILTER) ×3 IMPLANT
FORMALIN 10 PREFIL 120ML (MISCELLANEOUS) ×3 IMPLANT
GLOVE BIO SURGEON STRL SZ7.5 (GLOVE) ×3 IMPLANT
GLOVE BIOGEL PI IND STRL 7.0 (GLOVE) IMPLANT
GLOVE BIOGEL PI IND STRL 8 (GLOVE) ×1 IMPLANT
GLOVE BIOGEL PI INDICATOR 7.0 (GLOVE) ×4
GLOVE BIOGEL PI INDICATOR 8 (GLOVE) ×2
GLOVE ECLIPSE 6.5 STRL STRAW (GLOVE) ×2 IMPLANT
GLOVE ECLIPSE 7.0 STRL STRAW (GLOVE) ×2 IMPLANT
GLOVE EXAM NITRILE MD LF STRL (GLOVE) ×2 IMPLANT
GOWN STRL REUS W/TWL LRG LVL3 (GOWN DISPOSABLE) ×9 IMPLANT
HEMOSTAT SNOW SURGICEL 2X4 (HEMOSTASIS) ×3 IMPLANT
INST SET LAPROSCOPIC AP (KITS) ×3 IMPLANT
IV NS IRRIG 3000ML ARTHROMATIC (IV SOLUTION) IMPLANT
KIT ROOM TURNOVER APOR (KITS) ×3 IMPLANT
MANIFOLD NEPTUNE II (INSTRUMENTS) ×3 IMPLANT
NDL INSUFFLATION 14GA 120MM (NEEDLE) ×1 IMPLANT
NEEDLE INSUFFLATION 14GA 120MM (NEEDLE) ×3 IMPLANT
NS IRRIG 1000ML POUR BTL (IV SOLUTION) ×3 IMPLANT
PACK LAP CHOLE LZT030E (CUSTOM PROCEDURE TRAY) ×3 IMPLANT
PAD ARMBOARD 7.5X6 YLW CONV (MISCELLANEOUS) ×3 IMPLANT
POUCH SPECIMEN RETRIEVAL 10MM (ENDOMECHANICALS) ×3 IMPLANT
SET BASIN LINEN APH (SET/KITS/TRAYS/PACK) ×3 IMPLANT
SET TUBE IRRIG SUCTION NO TIP (IRRIGATION / IRRIGATOR) IMPLANT
SLEEVE ENDOPATH XCEL 5M (ENDOMECHANICALS) ×3 IMPLANT
SPONGE GAUZE 2X2 8PLY STER LF (GAUZE/BANDAGES/DRESSINGS) ×4
SPONGE GAUZE 2X2 8PLY STRL LF (GAUZE/BANDAGES/DRESSINGS) ×8 IMPLANT
STAPLER VISISTAT (STAPLE) ×3 IMPLANT
SUT VICRYL 0 UR6 27IN ABS (SUTURE) ×3 IMPLANT
TAPE CLOTH SURG 4X10 WHT LF (GAUZE/BANDAGES/DRESSINGS) ×2 IMPLANT
TROCAR ENDO BLADELESS 11MM (ENDOMECHANICALS) ×3 IMPLANT
TROCAR XCEL NON-BLD 5MMX100MML (ENDOMECHANICALS) ×3 IMPLANT
TROCAR XCEL UNIV SLVE 11M 100M (ENDOMECHANICALS) ×3 IMPLANT
TUBING INSUFFLATION (TUBING) ×3 IMPLANT
WARMER LAPAROSCOPE (MISCELLANEOUS) ×3 IMPLANT
YANKAUER SUCT 12FT TUBE ARGYLE (SUCTIONS) ×3 IMPLANT

## 2013-02-24 NOTE — Anesthesia Procedure Notes (Addendum)
Procedure Name: Intubation Date/Time: 02/24/2013 1:15 PM Performed by: Tressie Stalker E Pre-anesthesia Checklist: Patient identified, Patient being monitored, Timeout performed, Emergency Drugs available and Suction available Patient Re-evaluated:Patient Re-evaluated prior to inductionOxygen Delivery Method: Circle System Utilized Preoxygenation: Pre-oxygenation with 100% oxygen Intubation Type: IV induction, Rapid sequence and Cricoid Pressure applied Ventilation: Mask ventilation without difficulty Laryngoscope Size: Mac and 3 Grade View: Grade I Tube type: Oral Tube size: 7.0 mm Number of attempts: 1 Airway Equipment and Method: stylet Placement Confirmation: ETT inserted through vocal cords under direct vision,  positive ETCO2 and breath sounds checked- equal and bilateral Secured at: 21 cm Tube secured with: Tape Dental Injury: Teeth and Oropharynx as per pre-operative assessment

## 2013-02-24 NOTE — Anesthesia Postprocedure Evaluation (Signed)
  Anesthesia Post-op Note  Patient: Crystal Zamora  Procedure(s) Performed: Procedure(s): LAPAROSCOPIC CHOLECYSTECTOMY (N/A)  Patient Location: PACU  Anesthesia Type:General  Level of Consciousness: awake, alert  and oriented  Airway and Oxygen Therapy: Patient Spontanous Breathing and Patient connected to face mask oxygen  Post-op Pain: mild  Post-op Assessment: Post-op Vital signs reviewed, Patient's Cardiovascular Status Stable, Respiratory Function Stable, Patent Airway and No signs of Nausea or vomiting  Post-op Vital Signs: Reviewed and stable  Complications: No apparent anesthesia complications

## 2013-02-24 NOTE — Op Note (Signed)
Patient:  PAIJE GOODHART  DOB:  March 03, 1985  MRN:  [id number]   Preop Diagnosis:  Biliary colic, cholelithiasis  Postop Diagnosis:  Same  Procedure:  Laparoscopic cholecystectomy  Surgeon:  Aviva Signs, M.D.  Anes:  General endotracheal  Indications:  Patient is a 28 year old white female presents with biliary colic secondary to cholelithiasis. The risks and benefits of the procedure including bleeding, infection, hepatobiliary injury, the possibility of an open procedure were fully explained to the patient, who gave informed consent.  Procedure note:  The patient is placed the supine position. After induction of general endotracheal anesthesia, the abdomen was prepped and draped using usual sterile technique with DuraPrep. Surgical site confirmation was performed.  A supraumbilical incision was made down to the fascia. A Veress needle was introduced into the abdominal cavity and confirmation of placement was done using the saline drop test. The abdomen was Linda 16 mmHg pressure. An 11 mm trocar was introduced into the abdominal cavity under direct visualization without difficulty. The patient was placed in reverse Trendelenburg position and additional 11 mm trocar was placed the epigastric region. I millimeter trochars were placed in the right upper quadrant and right flank regions. The liver was inspected and noted within normal limits. The gallbladder was retracted in a dynamic fashion in order to expose the triangle of Calot. The cystic duct was first identified. Its juncture to the infundibulum was fully identified. Endoclips placed proximally and distally on the cystic duct, and the cystic duct was divided. This was likewise done to the cystic artery. The gallbladder was then freed away from the gallbladder fossa using Bovie electrocautery. The gallbladder was delivered through the epigastric trocar site using an Endo Catch bag. The gallbladder fossa was inspected and no abnormal bleeding  or bile leakage was noted. Surgicel is placed the gallbladder fossa. All fluid and air were then evacuated from the abdominal cavity prior to removal of the trochars.  All wounds were irrigated with normal saline. All wounds were injected with 0.5% Sensorcaine. The supraumbilical fascia was reapproximated using 0 Vicryl interrupted suture. All skin incisions were closed using staples. Betadine ointment and dry sterile dressings were applied.  All tape and needle counts were correct at the end of the procedure. The patient was extubated in the operating room and transferred to PACU in stable condition.  Complications:  None  EBL:  Minimal  Specimen:  Gallbladder

## 2013-02-24 NOTE — Discharge Instructions (Signed)
Laparoscopic Cholecystectomy, Care After °Refer to this sheet in the next few weeks. These instructions provide you with information on caring for yourself after your procedure. Your health care provider may also give you more specific instructions. Your treatment has been planned according to current medical practices, but problems sometimes occur. Call your health care provider if you have any problems or questions after your procedure. °WHAT TO EXPECT AFTER THE PROCEDURE °After your procedure, it is typical to have the following: °· Pain at your incision sites. You will be given pain medicines to control the pain. °· Mild nausea or vomiting. This should improve after the first 24 hours. °· Bloating and possibly shoulder pain from the gas used during the procedure. This will improve after the first 24 hours. °HOME CARE INSTRUCTIONS  °· Change bandages (dressings) as directed by your health care provider. °· Keep the wound dry and clean. You may wash the wound gently with soap and water. Gently blot or dab the area dry. °· Do not take baths or use swimming pools or hot tubs for 2 weeks or until your health care provider approves. °· Only take over-the-counter or prescription medicines as directed by your health care provider. °· Continue your normal diet as directed by your health care provider. °· Do not lift anything heavier than 10 pounds (4.5 kg) until your health care provider approves. °· Do not play contact sports for 1 week or until your health care provider approves. °SEEK MEDICAL CARE IF:  °· You have redness, swelling, or increasing pain in the wound. °· You notice yellowish-white fluid (pus) coming from the wound. °· You have drainage from the wound that lasts longer than 1 day. °· You notice a bad smell coming from the wound or dressing. °· Your surgical cuts (incisions) break open. °SEEK IMMEDIATE MEDICAL CARE IF:  °· You develop a rash. °· You have difficulty breathing. °· You have chest pain. °· You  have a fever. °· You have increasing pain in the shoulders (shoulder strap areas). °· You have dizzy episodes or faint while standing. °· You have severe abdominal pain. °· You feel sick to your stomach (nauseous) or throw up (vomit) and this lasts for more than 1 day. °Document Released: 01/12/2005 Document Revised: 11/02/2012 Document Reviewed: 08/24/2012 °[employer]® Patient Information ©2014 [employer]. ° °

## 2013-02-24 NOTE — Transfer of Care (Signed)
Immediate Anesthesia Transfer of Care Note  Patient: Crystal Zamora  Procedure(s) Performed: Procedure(s): LAPAROSCOPIC CHOLECYSTECTOMY (N/A)  Patient Location: PACU  Anesthesia Type:General  Level of Consciousness: awake, alert  and oriented  Airway & Oxygen Therapy: Patient Spontanous Breathing and Patient connected to face mask oxygen  Post-op Assessment: Report given to PACU RN  Post vital signs: Reviewed and stable  Complications: No apparent anesthesia complications

## 2013-02-24 NOTE — Interval H&P Note (Signed)
History and Physical Interval Note:  02/24/2013 12:58 PM  Crystal Zamora  has presented today for surgery, with the diagnosis of cholelithiasis  The various methods of treatment have been discussed with the patient and family. After consideration of risks, benefits and other options for treatment, the patient has consented to  Procedure(s): LAPAROSCOPIC CHOLECYSTECTOMY (N/A) as a surgical intervention .  The patient's history has been reviewed, patient examined, no change in status, stable for surgery.  I have reviewed the patient's chart and labs.  Questions were answered to the patient's satisfaction.     Aviva Signs A

## 2013-02-24 NOTE — Anesthesia Preprocedure Evaluation (Signed)
Anesthesia Evaluation  Patient identified by MRN, date of birth, ID band Patient awake    Reviewed: Allergy & Precautions, H&P , NPO status , Patient's Chart, lab work & pertinent test results  Airway Mallampati: I TM Distance: >3 FB Neck ROM: Full    Dental  (+) Teeth Intact   Pulmonary Current Smoker,  breath sounds clear to auscultation        Cardiovascular negative cardio ROS  Rhythm:Regular Rate:Normal     Neuro/Psych    GI/Hepatic GERD-  Medicated,  Endo/Other    Renal/GU      Musculoskeletal   Abdominal   Peds  Hematology   Anesthesia Other Findings   Reproductive/Obstetrics                           Anesthesia Physical Anesthesia Plan  ASA: II  Anesthesia Plan: General   Post-op Pain Management:    Induction: Intravenous, Rapid sequence and Cricoid pressure planned  Airway Management Planned: Oral ETT  Additional Equipment:   Intra-op Plan:   Post-operative Plan: Extubation in OR  Informed Consent: I have reviewed the patients History and Physical, chart, labs and discussed the procedure including the risks, benefits and alternatives for the proposed anesthesia with the patient or authorized representative who has indicated his/her understanding and acceptance.     Plan Discussed with:   Anesthesia Plan Comments:         Anesthesia Quick Evaluation

## 2013-02-26 NOTE — Addendum Note (Signed)
Addendum created 02/26/13 0803 by Ollen Bowl, CRNA   Modules edited: Anesthesia Medication Administration

## 2013-02-27 ENCOUNTER — Encounter (HOSPITAL_COMMUNITY): Payer: Self-pay | Admitting: General Surgery

## 2013-11-27 ENCOUNTER — Encounter (HOSPITAL_COMMUNITY): Payer: Self-pay | Admitting: General Surgery

## 2014-03-11 ENCOUNTER — Emergency Department (HOSPITAL_COMMUNITY)
Admission: EM | Admit: 2014-03-11 | Discharge: 2014-03-11 | Disposition: A | Discharge status: Home / Self Care | Payer: PRIVATE HEALTH INSURANCE | Attending: Internal Medicine | Admitting: Internal Medicine

## 2014-03-11 ENCOUNTER — Encounter (HOSPITAL_COMMUNITY): Payer: Self-pay | Admitting: Emergency Medicine

## 2014-03-11 ENCOUNTER — Emergency Department (HOSPITAL_COMMUNITY): Payer: PRIVATE HEALTH INSURANCE

## 2014-03-11 DIAGNOSIS — Z79899 Other long term (current) drug therapy: Secondary | ICD-10-CM | POA: Insufficient documentation

## 2014-03-11 DIAGNOSIS — Z87448 Personal history of other diseases of urinary system: Secondary | ICD-10-CM | POA: Insufficient documentation

## 2014-03-11 DIAGNOSIS — B029 Zoster without complications: Secondary | ICD-10-CM

## 2014-03-11 DIAGNOSIS — J019 Acute sinusitis, unspecified: Secondary | ICD-10-CM

## 2014-03-11 DIAGNOSIS — H538 Other visual disturbances: Secondary | ICD-10-CM | POA: Diagnosis present

## 2014-03-11 DIAGNOSIS — N189 Chronic kidney disease, unspecified: Secondary | ICD-10-CM | POA: Insufficient documentation

## 2014-03-11 DIAGNOSIS — Z85820 Personal history of malignant melanoma of skin: Secondary | ICD-10-CM | POA: Insufficient documentation

## 2014-03-11 DIAGNOSIS — K219 Gastro-esophageal reflux disease without esophagitis: Secondary | ICD-10-CM | POA: Insufficient documentation

## 2014-03-11 DIAGNOSIS — Z720 Tobacco use: Secondary | ICD-10-CM | POA: Insufficient documentation

## 2014-03-11 LAB — CBC WITH DIFFERENTIAL/PLATELET
Basophils Absolute: 0.1 10*3/uL (ref 0.0–0.1)
Basophils Relative: 1 % (ref 0–1)
EOS ABS: 0.2 10*3/uL (ref 0.0–0.7)
Eosinophils Relative: 2 % (ref 0–5)
HCT: 40.5 % (ref 36.0–46.0)
HEMOGLOBIN: 13.7 g/dL (ref 12.0–15.0)
Lymphocytes Relative: 21 % (ref 12–46)
Lymphs Abs: 2.2 10*3/uL (ref 0.7–4.0)
MCH: 31.5 pg (ref 26.0–34.0)
MCHC: 33.8 g/dL (ref 30.0–36.0)
MCV: 93.1 fL (ref 78.0–100.0)
MONO ABS: 0.7 10*3/uL (ref 0.1–1.0)
Monocytes Relative: 7 % (ref 3–12)
NEUTROS ABS: 7.5 10*3/uL (ref 1.7–7.7)
NEUTROS PCT: 69 % (ref 43–77)
PLATELETS: 234 10*3/uL (ref 150–400)
RBC: 4.35 MIL/uL (ref 3.87–5.11)
RDW: 12.4 % (ref 11.5–15.5)
WBC: 10.7 10*3/uL — ABNORMAL HIGH (ref 4.0–10.5)

## 2014-03-11 LAB — BASIC METABOLIC PANEL
ANION GAP: 4 — AB (ref 5–15)
BUN: 13 mg/dL (ref 6–23)
CALCIUM: 8.5 mg/dL (ref 8.4–10.5)
CO2: 24 mmol/L (ref 19–32)
Chloride: 110 mmol/L (ref 96–112)
Creatinine, Ser: 0.67 mg/dL (ref 0.50–1.10)
GFR calc Af Amer: >90 mL/min (ref >90)
GFR calc non Af Amer: >90 mL/min (ref >90)
Glucose, Bld: 92 mg/dL (ref 70–99)
Potassium: 3.7 mmol/L (ref 3.5–5.1)
Sodium: 138 mmol/L (ref 135–145)

## 2014-03-11 MED ORDER — BACITRACIN-POLYMYXIN B 500-10000 UNIT/GM OP OINT: TOPICAL_OINTMENT | OPHTHALMIC | Status: DC

## 2014-03-11 MED ORDER — LEVOFLOXACIN 750 MG PO TABS: 750.0000 mg | ORAL_TABLET | Freq: Every day | ORAL | Status: DC

## 2014-03-11 MED ORDER — MORPHINE SULFATE 4 MG/ML IJ SOLN
4.0000 mg | Freq: Once | INTRAMUSCULAR | Status: AC
  Administered 2014-03-11: 4 mg via INTRAVENOUS
  Filled 2014-03-11: qty 1

## 2014-03-11 MED ORDER — VALACYCLOVIR HCL 1 G PO TABS: 1000.0000 mg | ORAL_TABLET | Freq: Three times a day (TID) | ORAL | Status: DC

## 2014-03-11 MED ORDER — KETOROLAC TROMETHAMINE 30 MG/ML IJ SOLN
30.0000 mg | Freq: Once | INTRAMUSCULAR | Status: AC
  Administered 2014-03-11: 30 mg via INTRAVENOUS
  Filled 2014-03-11: qty 1

## 2014-03-11 MED ORDER — ONDANSETRON HCL 4 MG/2ML IJ SOLN
4.0000 mg | Freq: Once | INTRAMUSCULAR | Status: AC
  Administered 2014-03-11: 4 mg via INTRAVENOUS

## 2014-03-11 MED ORDER — ONDANSETRON HCL 4 MG/2ML IJ SOLN
4.0000 mg | Freq: Once | INTRAMUSCULAR | Status: DC
  Filled 2014-03-11: qty 2

## 2014-03-11 MED ORDER — DEXTROSE 5 % IV SOLN
10.0000 mg/kg | Freq: Once | INTRAVENOUS | Status: AC
  Administered 2014-03-11: 905 mg via INTRAVENOUS
  Filled 2014-03-11: qty 18.1

## 2014-03-11 NOTE — ED Notes (Addendum)
Pt reports headache, right eye swelling,right sided facial swelling progressively getting worse over the last three weeks.pt denies any injury. Pt reports blurred vision of right eye x2 days.

## 2014-03-11 NOTE — Consult Note (Signed)
[facility] Medical Consultation  Crystal Zamora OVF:[id number] DOB: 1985-06-22 DOA: 03/11/2014 PCP: No PCP Per Patient   Requesting physician: Dr. Lacinda Axon, ER physician Date of consultation: 03/11/14 Reason for consultation: blurry vision  Impression/Recommendations Active Problems:   Acute sinusitis   Blurry vision, right eye   Shingles rash    1. Right-sided blurry vision with possible shingles rash. The patient may have underlying herpes zoster. She does report having history of chickenpox in the past. Her rash at this time is not terribly convincing. In any case, since there is eye involvement, we'll start the patient on Valtrex 1 g by mouth 3 times a day. Case was discussed with Dr. Prudencio Burly on call for ophthalmology. Symptoms were discussed and CT scan was reviewed. It was felt the patient would not require inpatient hospitalization at this time and could be adequately treated with oral Valtrex in the outpatient setting. She will be followed up by ophthalmology tomorrow. Further recommendations include bacitracin ophthalmic ointment to be placed on pustular lesions. The patient was advised that if her vision worsened, that she should return to the emergency room for evaluation. Dr. Prudencio Burly office will contact the patient tomorrow to schedule an appointment. 2. Acute sinusitis. This could likely also explain the patient's underlying headache. CT scan showed inflammation of the right maxillary/nasal sinuses. Of note, there did not appear to be any orbital involvement on CT. Start the patient on a course of levofloxacin.  Chief Complaint: blurry vision  HPI:  This is a 29 year old female with no significant past medical history who presents to the emergency room with complaints of blurry vision. Patient's symptoms began several weeks ago when she started experiencing headaches. She attributes these headaches to underlying stress. Has 1-2 weeks, she's noticed eruption of small pustular lesions  over her right scalp, right forehead, and under her right eyebrow. The skin around this area is pruritic. She's noticed some swelling around her right eye. This morning, she noticed blurry vision in her right eye and therefore came to the ER for evaluation. She's felt that she she may have had a fever on and off over the past week, but she has not checked her temperature. She is not noticed any discharge from her eye. She describes some pain around her right ear. She is also noticed nasal congestion. She was evaluated in the ER there were concerns for possible shingles-related herpes zoster ophthalmicus. She was given a dose of IV acyclovir and [facility] were asked to evaluate for admission.  Review of Systems:  Pertinent positives as per HPI, otherwise negative  Past Medical History  Diagnosis Date  . Endometriosis   . Endometriosis   . GERD (gastroesophageal reflux disease)   . Chronic kidney disease     stones   . Cancer     skin melanoma  ,  6 removed stomach and back   Past Surgical History  Procedure Laterality Date  . Laporscopic      r/o endometriosis  . Cesarean section    . Cholecystectomy N/A 02/24/2013    Procedure: LAPAROSCOPIC CHOLECYSTECTOMY;  Surgeon: Jamesetta So, MD;  Location: [facility];  Service: General;  Laterality: N/A;   Social History:  reports that she has been smoking.  She does not have any smokeless tobacco history on file. She reports that she does not drink alcohol or use illicit drugs.  No Known Allergies  Family History: Mother, Living; Healthy; Father, Living; Heart disease;  Prior to Admission medications   Medication  Sig Start Date End Date Taking? Authorizing Provider  acetaminophen (TYLENOL) 325 MG tablet Take 650-975 mg by mouth every 6 (six) hours as needed for moderate pain.   Yes Historical Provider, MD  guaiFENesin (MUCINEX) 600 MG 12 hr tablet Take 600 mg by mouth 2 (two) times daily.   Yes Historical Provider, MD  ibuprofen  (ADVIL,MOTRIN) 200 MG tablet Take 400 mg by mouth every 8 (eight) hours as needed for mild pain.   Yes Historical Provider, MD  bacitracin-polymyxin b (POLYSPORIN) ophthalmic ointment apply pustular lesions on forehead twice a day 03/11/14   Kathie Dike, MD  HYDROcodone-acetaminophen (NORCO/VICODIN) 5-325 MG per tablet Take 1-2 tablets by mouth every 4 (four) hours as needed for moderate pain. Patient not taking: Reported on 03/11/2014 02/24/13   Jamesetta So, MD  levofloxacin (LEVAQUIN) 750 MG tablet Take 1 tablet (750 mg total) by mouth daily. 03/11/14   Kathie Dike, MD  omeprazole (PRILOSEC) 40 MG capsule Take 1 capsule (40 mg total) by mouth daily. Patient not taking: Reported on 03/11/2014 02/15/13   Karen Chafe Molpus, MD  ondansetron (ZOFRAN ODT) 4 MG disintegrating tablet Take 1 tablet (4 mg total) by mouth every 8 (eight) hours as needed for nausea or vomiting. 8mg  ODT q4 hours prn nausea Patient not taking: Reported on 03/11/2014 02/15/13   Karen Chafe Molpus, MD  sucralfate (CARAFATE) 1 G tablet Take 1 tablet (1 g total) by mouth 4 (four) times daily -  with meals and at bedtime. Patient not taking: Reported on 03/11/2014 02/15/13   Karen Chafe Molpus, MD  valACYclovir (VALTREX) 1000 MG tablet Take 1 tablet (1,000 mg total) by mouth 3 (three) times daily. 03/11/14   Kathie Dike, MD   Physical Exam: Blood pressure 129/80, pulse 61, temperature 98.6 F (37 C), temperature source Oral, resp. rate 14, height 5\' 7"  (1.702 m), weight 90.719 kg (200 lb), last menstrual period 03/11/2014, SpO2 100 %. Filed Vitals:   03/11/14 1519  BP: 129/80  Pulse: 61  Temp: 98.6 F (37 C)  Resp: 14     General:  NAD  Eyes: no discharge noted from either eye, conjunctivae are clear, visual acuity: left 20/25, right 20/30  ENT: right Tympanic membrane noted to be erythematous, tenderness noted over right maxillary sinus, no significant pharyngeal erythema  Neck: supple  Cardiovascular: s1, s2,  rrr  Respiratory: cta b  Abdomen: soft, nt, nd, bs+  Skin: 3-4 small pustular lesions noted over right eyelid/eyebrow and forehead  Musculoskeletal: normal tone and bulk   Psychiatric: normal affect, appropriate  Neurologic: grossly intact, nonfocal  Labs on Admission:  Basic Metabolic Panel:  Recent Labs Lab 03/11/14 1404  NA 138  K 3.7  CL 110  CO2 24  GLUCOSE 92  BUN 13  CREATININE 0.67  CALCIUM 8.5   Liver Function Tests: No results for input(s): AST, ALT, ALKPHOS, BILITOT, PROT, ALBUMIN in the last 168 hours. No results for input(s): LIPASE, AMYLASE in the last 168 hours. No results for input(s): AMMONIA in the last 168 hours. CBC:  Recent Labs Lab 03/11/14 1404  WBC 10.7*  NEUTROABS 7.5  HGB 13.7  HCT 40.5  MCV 93.1  PLT 234   Cardiac Enzymes: No results for input(s): CKTOTAL, CKMB, CKMBINDEX, TROPONINI in the last 168 hours. BNP: Invalid input(s): POCBNP CBG: No results for input(s): GLUCAP in the last 168 hours.  Radiological Exams on Admission: Ct Maxillofacial Wo Cm  03/11/2014   CLINICAL DATA:  Headache, RIGHT side of face  swelling, for 3 weeks. Blurred vision in RIGHT eye for 2 days.  EXAM: CT MAXILLOFACIAL WITHOUT CONTRAST  TECHNIQUE: Multidetector CT imaging of the maxillofacial structures was performed. Multiplanar CT image reconstructions were also generated. A small metallic BB was placed on the right temple in order to reliably differentiate right from left.  COMPARISON:  CT maxillofacial 11/20/2005.  FINDINGS: No osseous findings are present.  No facial fracture is seen.  The orbits appear negative without preseptal or postseptal inflammation. Both globes are equal in size with normal-appearing extraocular musculature and optic nerves.  The RIGHT nasal cavity is abnormal with hypertrophy of the RIGHT middle turbinate, and RIGHT inferior turbinate. RIGHT middle turbinate concha bullosa is present. No definite nasal polyps, the appearance is more  that of inflammation. Minor fluid is seen in the dependent portion of the RIGHT anterior and posterior ethmoid air cells. Minor mucosal thickening along the medial wall of the RIGHT maxillary sinus. No sphenoid or frontal sinus disease. Slight nasal septal deviation RIGHT to LEFT with slight spurring.  Negative intracranial compartment. No maxillary pathology. BILATERAL mandibular dental caries in the molar teeth without subperiosteal abscess. Small RIGHT parotid lymph nodes in the superficial lobe appear reactive. Similar small level II and V nodes are seen bilaterally. No mastoid fluid.  Compared with priors, the RIGHT nasal cavity inflammation is increased.  IMPRESSION: RIGHT nasal cavity inflammation primarily affecting the RIGHT middle and RIGHT inferior turbinates.  Minor RIGHT ethmoid and RIGHT maxillary sinus mucosal thickening without air-fluid level. BILATERAL maxillary molar dental caries.  No significant facial cellulitic change.  No orbital findings.  Shotty BILATERAL level II and level V nodes.   Electronically Signed   By: Rolla Flatten M.D.   On: 03/11/2014 15:43    Time spent: 45mins  Crystal Zamora [facility] Pager [phone]  If 7PM-7AM, please contact night-coverage [email or web address] Password [facility] 03/11/2014, 5:04 PM

## 2014-03-11 NOTE — ED Provider Notes (Signed)
CSN: [id number]     Arrival date & time 03/11/14  1222 History   First MD Initiated Contact with Patient 03/11/14 1259     Chief Complaint  Patient presents with  . Eye Problem     (Consider location/radiation/quality/duration/timing/severity/associated sxs/prior Treatment) HPI.... Right frontal headache, swelling surrounding right eye, rash on left forehead and scalp for 2 days. Patient also complains of some blurred vision. No fevers, sweats, chills. Severity is moderate. Nothing makes symptoms better or worse. This has never happened before.  Past Medical History  Diagnosis Date  . Endometriosis   . Endometriosis   . GERD (gastroesophageal reflux disease)   . Chronic kidney disease     stones   . Cancer     skin melanoma  ,  6 removed stomach and back   Past Surgical History  Procedure Laterality Date  . Laporscopic      r/o endometriosis  . Cesarean section    . Cholecystectomy N/A 02/24/2013    Procedure: LAPAROSCOPIC CHOLECYSTECTOMY;  Surgeon: Jamesetta So, MD;  Location: [facility];  Service: General;  Laterality: N/A;   History reviewed. No pertinent family history. History  Substance Use Topics  . Smoking status: Current Every Day Smoker -- 1.00 packs/day  . Smokeless tobacco: Not on file  . Alcohol Use: No   OB History    Gravida Para Term Preterm AB TAB SAB Ectopic Multiple Living   3    2  2         Review of Systems  All other systems reviewed and are negative.     Allergies  Review of patient's allergies indicates no known allergies.  Home Medications   Prior to Admission medications   Medication Sig Start Date End Date Taking? Authorizing Provider  acetaminophen (TYLENOL) 325 MG tablet Take 650-975 mg by mouth every 6 (six) hours as needed for moderate pain.   Yes Historical Provider, MD  guaiFENesin (MUCINEX) 600 MG 12 hr tablet Take 600 mg by mouth 2 (two) times daily.   Yes Historical Provider, MD  ibuprofen (ADVIL,MOTRIN) 200 MG tablet Take 400  mg by mouth every 8 (eight) hours as needed for mild pain.   Yes Historical Provider, MD  HYDROcodone-acetaminophen (NORCO/VICODIN) 5-325 MG per tablet Take 1-2 tablets by mouth every 4 (four) hours as needed for moderate pain. Patient not taking: Reported on 03/11/2014 02/24/13   Jamesetta So, MD  omeprazole (PRILOSEC) 40 MG capsule Take 1 capsule (40 mg total) by mouth daily. Patient not taking: Reported on 03/11/2014 02/15/13   Karen Chafe Molpus, MD  ondansetron (ZOFRAN ODT) 4 MG disintegrating tablet Take 1 tablet (4 mg total) by mouth every 8 (eight) hours as needed for nausea or vomiting. 8mg  ODT q4 hours prn nausea Patient not taking: Reported on 03/11/2014 02/15/13   Karen Chafe Molpus, MD  sucralfate (CARAFATE) 1 G tablet Take 1 tablet (1 g total) by mouth 4 (four) times daily -  with meals and at bedtime. Patient not taking: Reported on 03/11/2014 02/15/13   Karen Chafe Molpus, MD   BP 127/65 mmHg  Pulse 88  Temp(Src) 98.1 F (36.7 C) (Oral)  Resp 20  Ht 5\' 7"  (1.702 m)  Wt 200 lb (90.719 kg)  BMI 31.32 kg/m2  SpO2 100%  LMP 03/11/2014 Physical Exam  Constitutional: She is oriented to person, place, and time. She appears well-developed and well-nourished.  HENT:  Head: Normocephalic and atraumatic.  Eyes: Conjunctivae and EOM are normal. Pupils are equal, round,  and reactive to light.  Minimal periorbital edema.  Neck: Normal range of motion. Neck supple.  Cardiovascular: Normal rate and regular rhythm.   Pulmonary/Chest: Effort normal and breath sounds normal.  Abdominal: Soft. Bowel sounds are normal.  Musculoskeletal: Normal range of motion.  Neurological: She is alert and oriented to person, place, and time.  Skin:  Oblique erythematous abraded papular rash right forehead  Psychiatric: She has a normal mood and affect. Her behavior is normal.  Nursing note and vitals reviewed.   ED Course  Procedures (including critical care time) Labs Review Labs Reviewed  CBC WITH  DIFFERENTIAL/PLATELET - Abnormal; Notable for the following:    WBC 10.7 (*)    All other components within normal limits  BASIC METABOLIC PANEL - Abnormal; Notable for the following:    Anion gap 4 (*)    All other components within normal limits    Imaging Review No results found.   EKG Interpretation None      MDM   Final diagnoses:  Shingles    Discussed clinical scenario with Dr. Roderic Palau.  Probable admission to [facility]. IV acyclovir started    Nat Christen, MD 03/11/14 1506

## 2014-12-11 IMAGING — US US ABDOMEN COMPLETE
1 series · 14 of 25 positions shown · non-contrast
Comparison: 07/25/2012

CLINICAL DATA: Abdominal pain

EXAM:
ULTRASOUND ABDOMEN COMPLETE

[Series 1: us abdomen complete · 0.21mm/px · 14 of 97 slices shown]
[im 1/97]
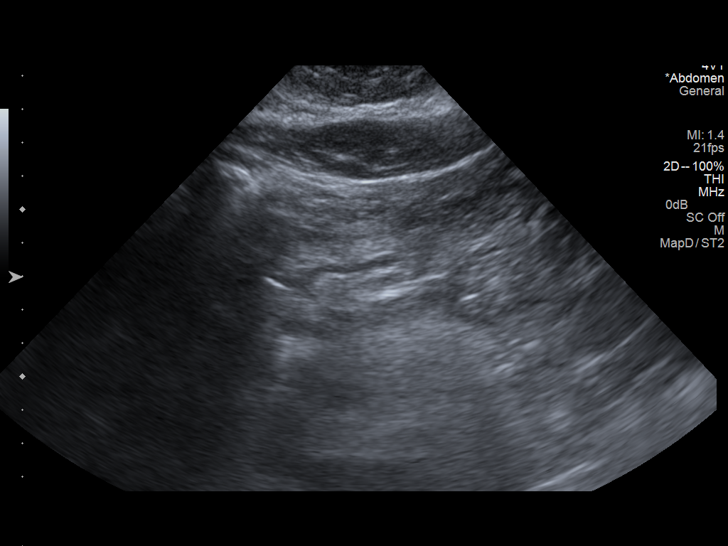
[im 9/97]
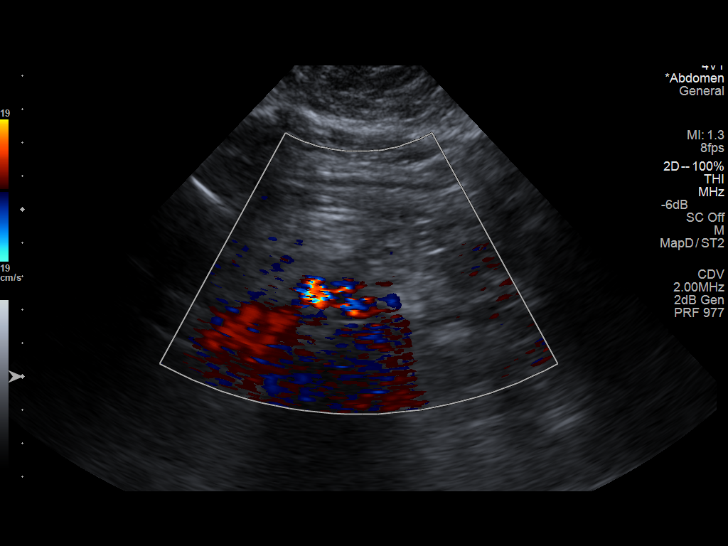
[im 17/97]
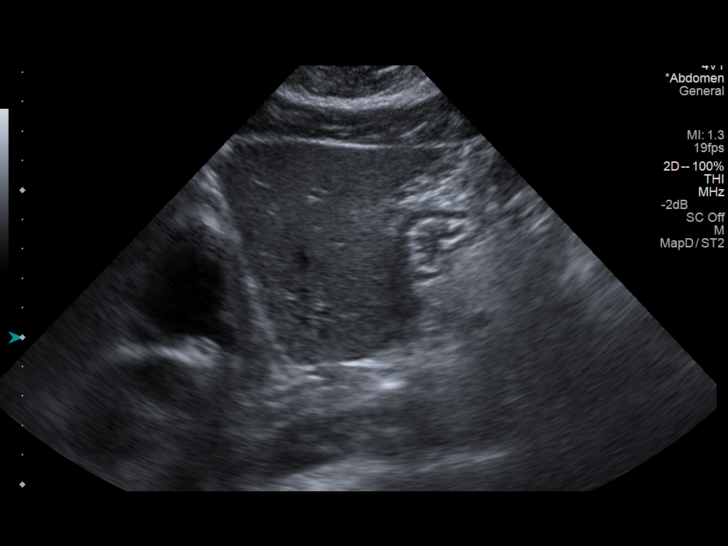
[im 25/97]
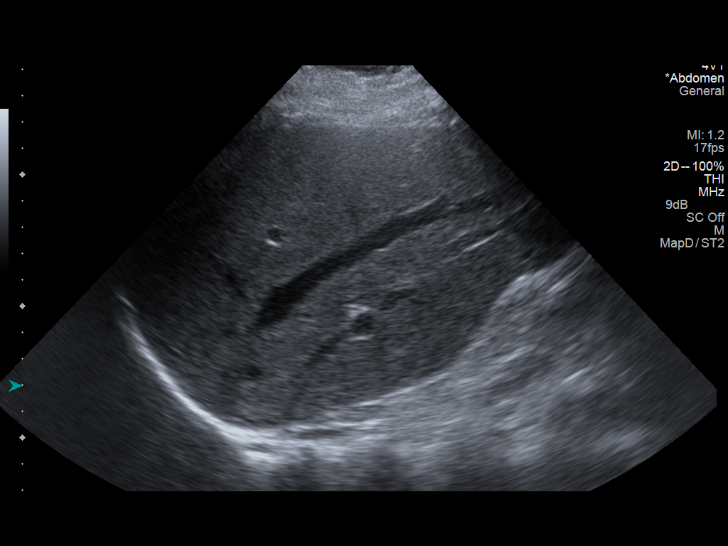
[im 33/97]
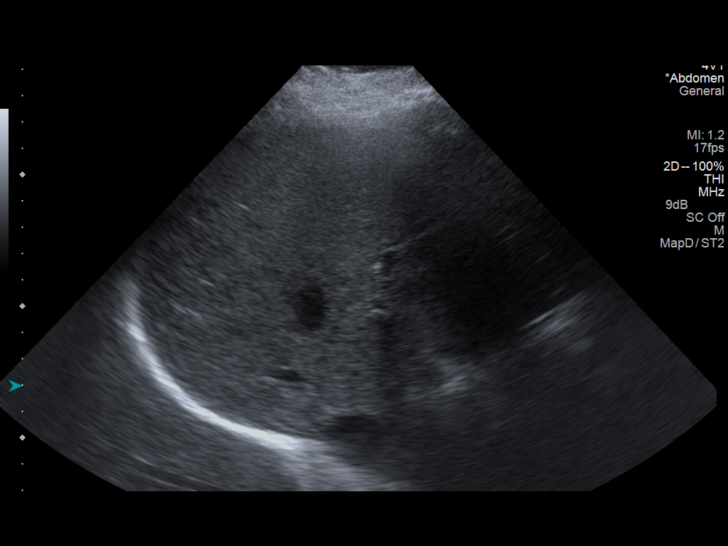
[im 37/97]
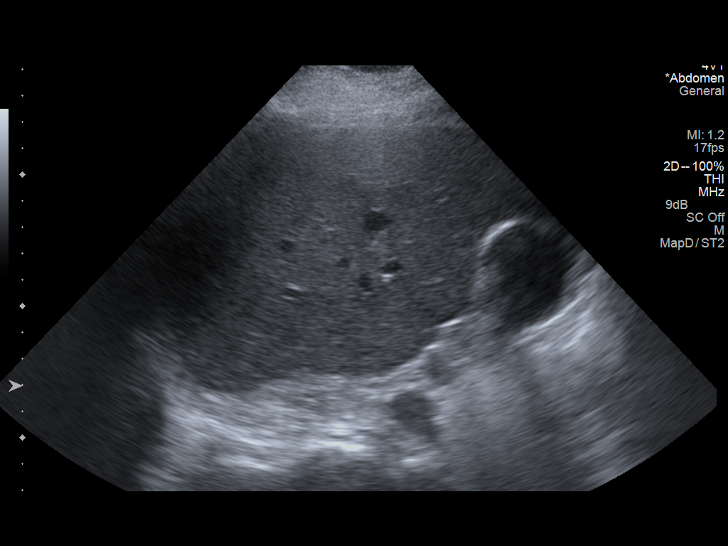
[im 45/97]
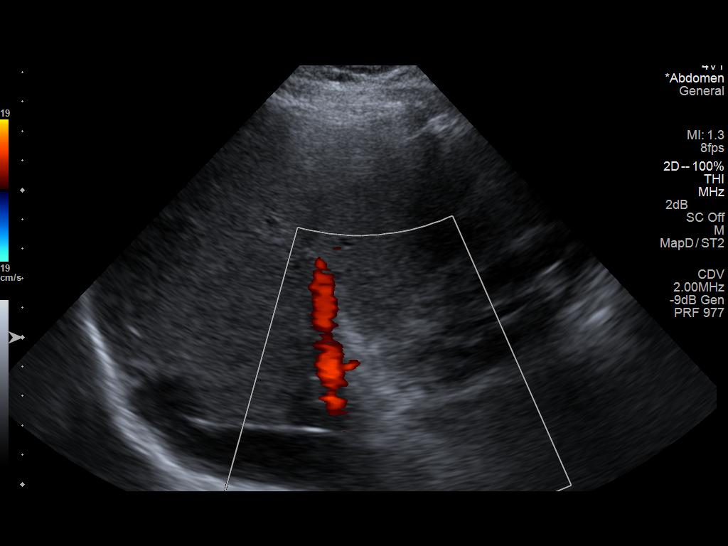
[im 53/97]
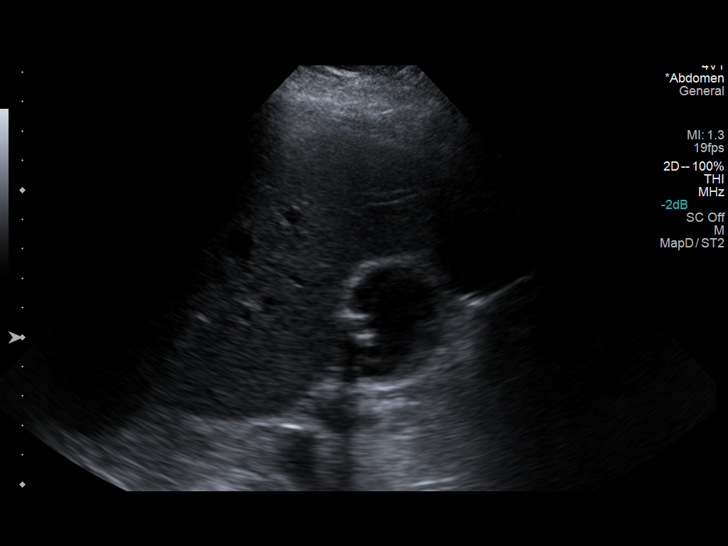
[im 61/97]
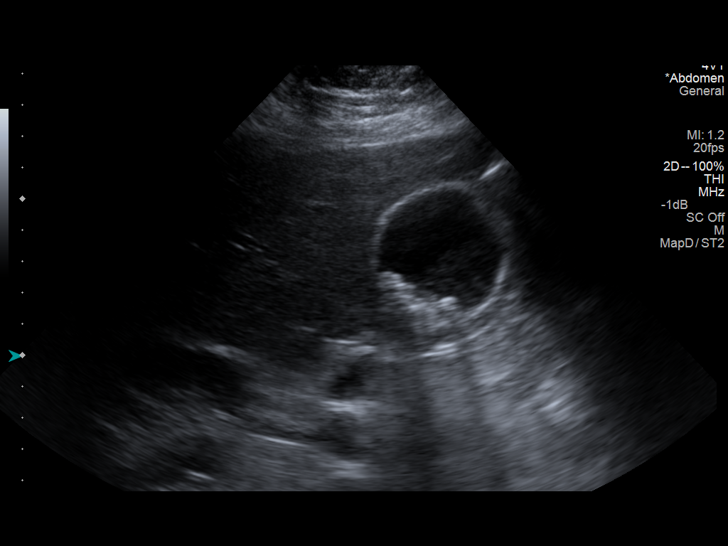
[im 65/97]
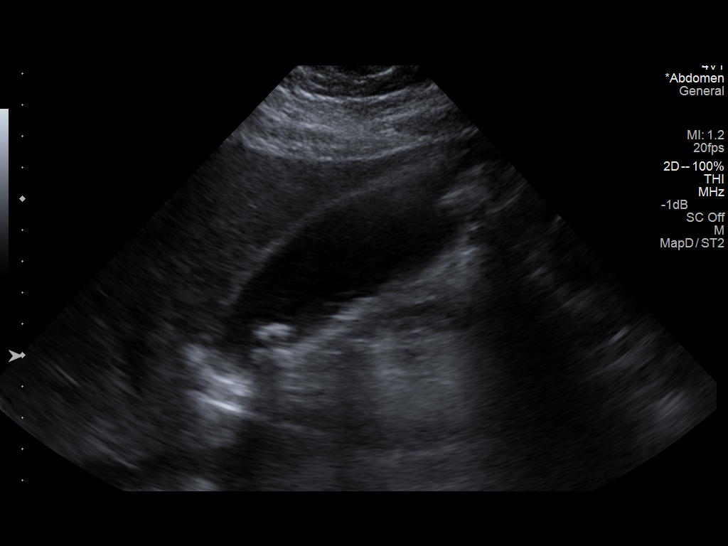
[im 73/97]
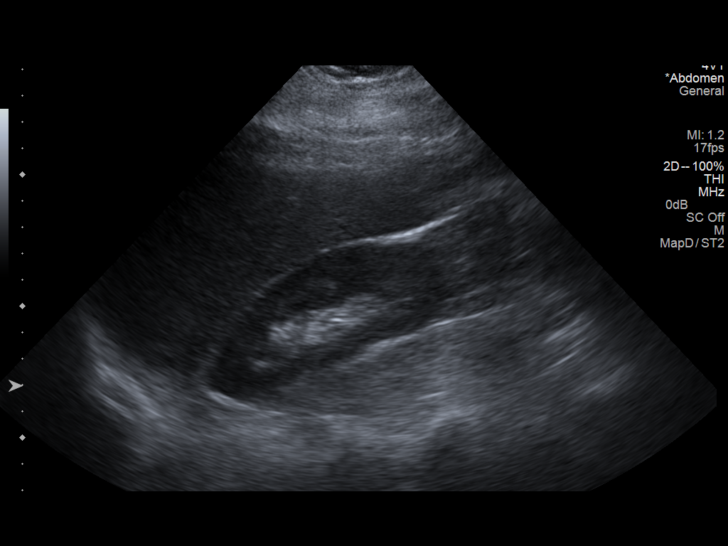
[im 81/97]
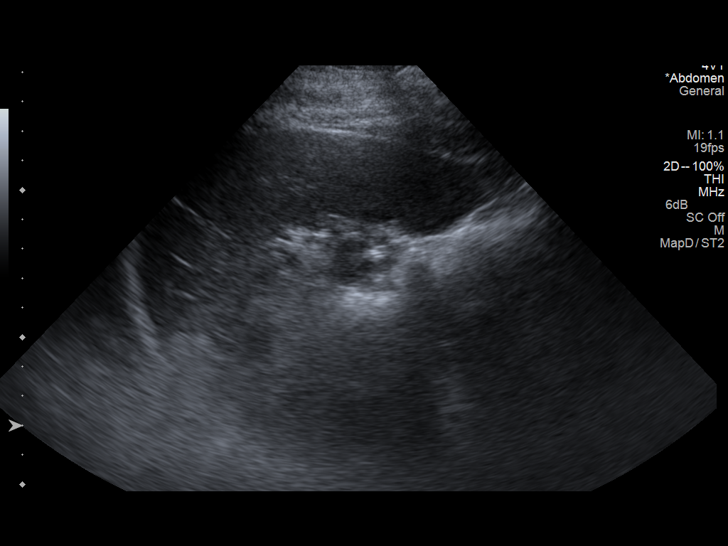
[im 89/97]
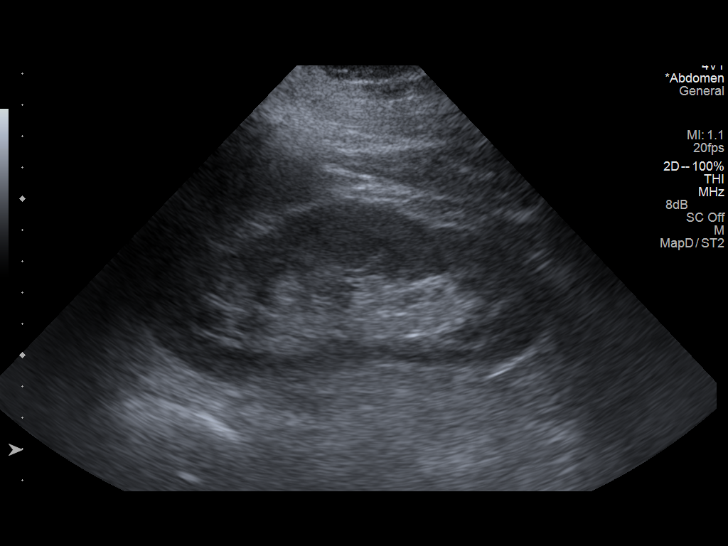
[im 97/97]
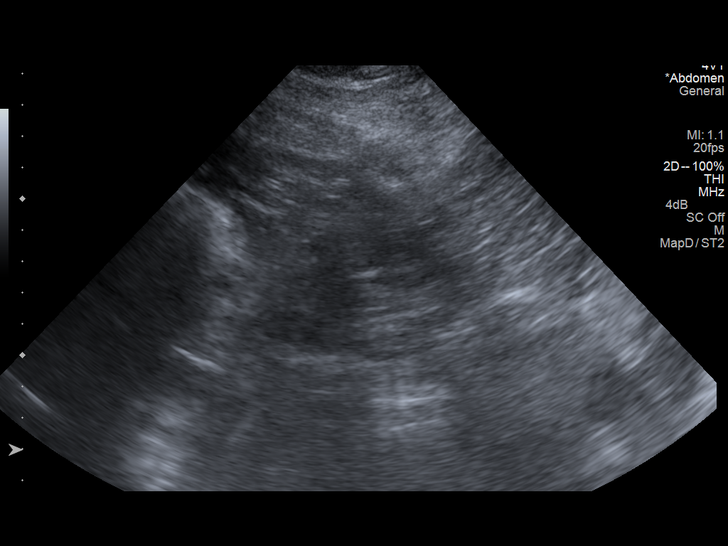

[14 of 25 positions shown; findings below may reference images not displayed]

FINDINGS: Gallbladder:

There are numerous mobile shadowing gallstones. No wall thickening.
No pericholecystic fluid. No evidence of acute cholecystitis.

Common bile duct:

Diameter: 3.4 mm.  No duct stone is seen.

Liver:

Liver is borderline enlarged measuring 20 cm. Liver is normal
echogenicity with no mass or focal lesion. Hepatopetal flow was
documented in the portal vein.

IVC:

No abnormality visualized.

Pancreas:

Visualized portion unremarkable.

Spleen:

Size and appearance within normal limits.

Right Kidney:

Length: 11.6 cm. Echogenicity within normal limits. No mass or
hydronephrosis visualized.

Left Kidney:

Length: 13.0 cm. Echogenicity within normal limits. No mass or
hydronephrosis visualized.

Abdominal aorta:

No aneurysm visualized.

Other findings:

None.
IMPRESSION: 1. Multiple gallstones.  No evidence of acute cholecystitis.
2. Borderline hepatomegaly.
3. No other abnormalities.

## 2014-12-11 IMAGING — CR DG ABDOMEN ACUTE W/ 1V CHEST
4 series · 4 of 4 positions shown · non-contrast
Comparison: CT abdomen and pelvis July 25, 2012

CLINICAL DATA: Pain and nausea

EXAM:
ACUTE ABDOMEN SERIES (ABDOMEN 2 VIEW & CHEST 1 VIEW)

[view not recorded (1 of 4)]
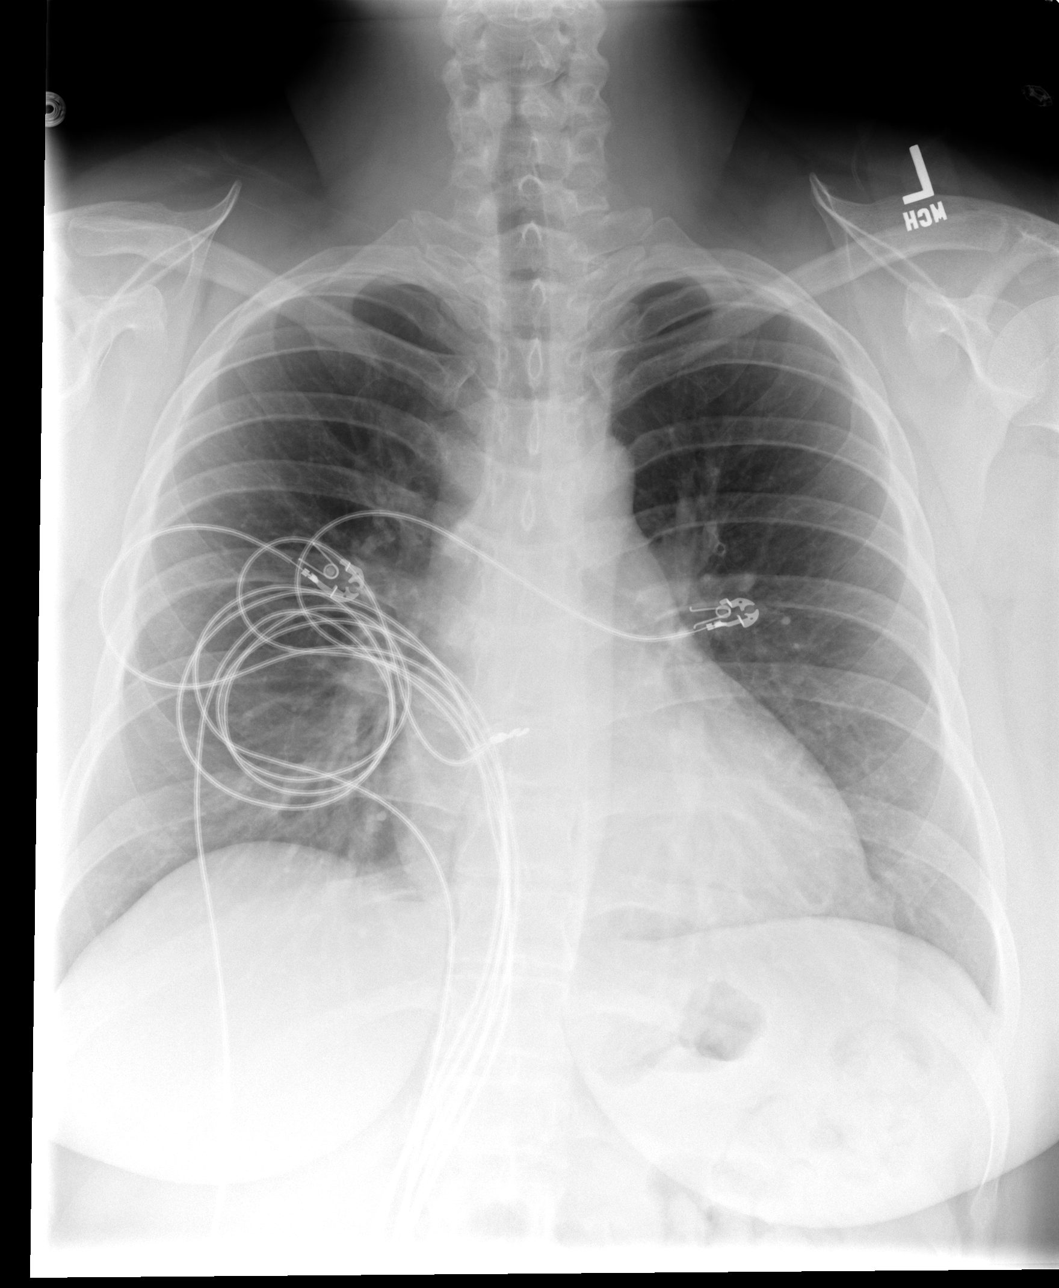

[view not recorded (2 of 4)]
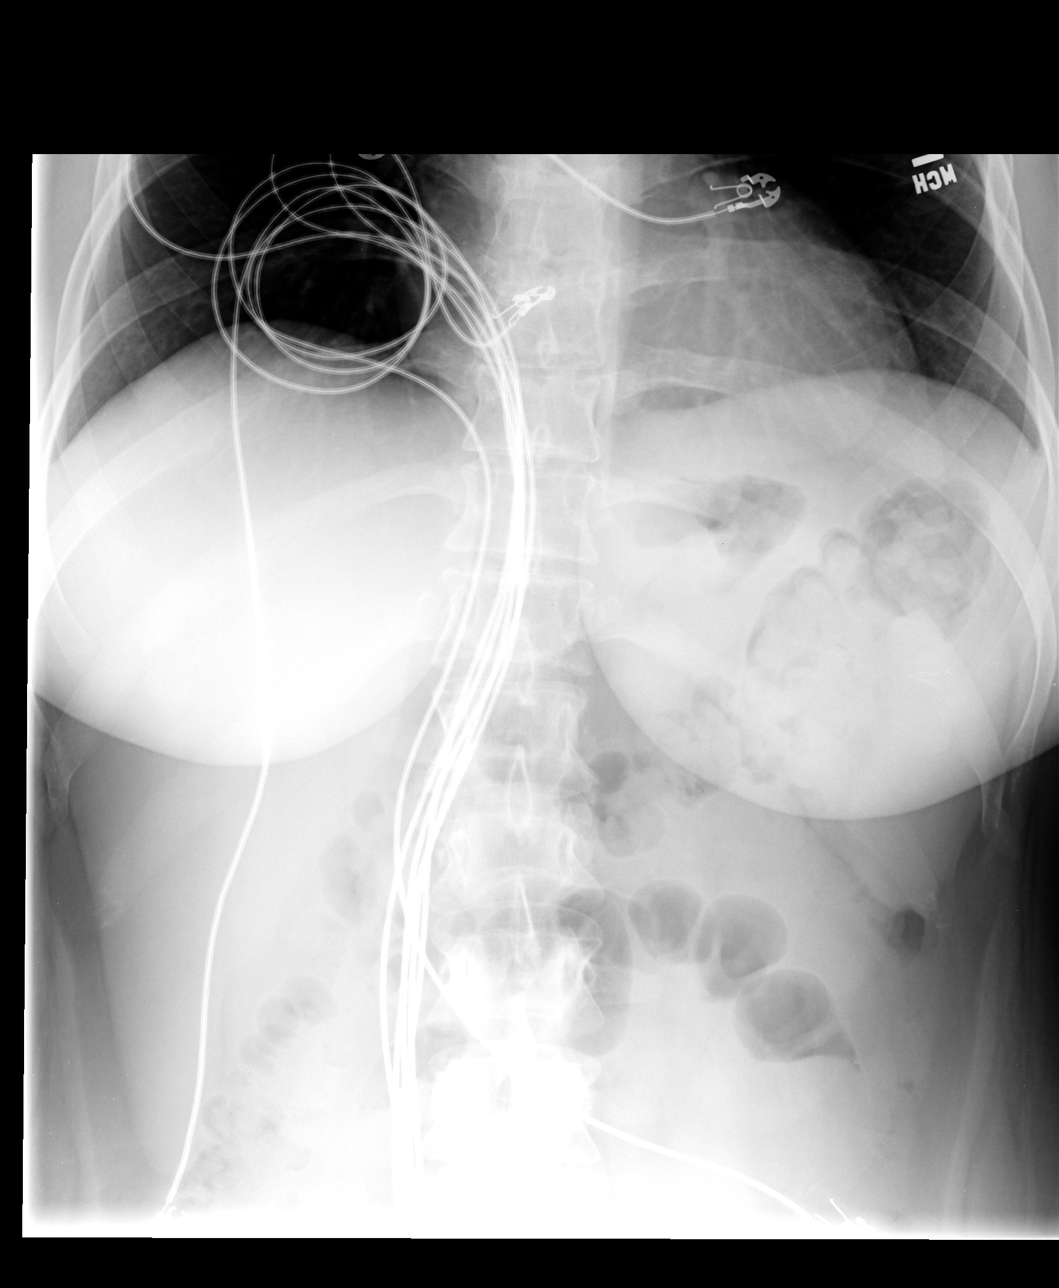

[view not recorded (3 of 4)]
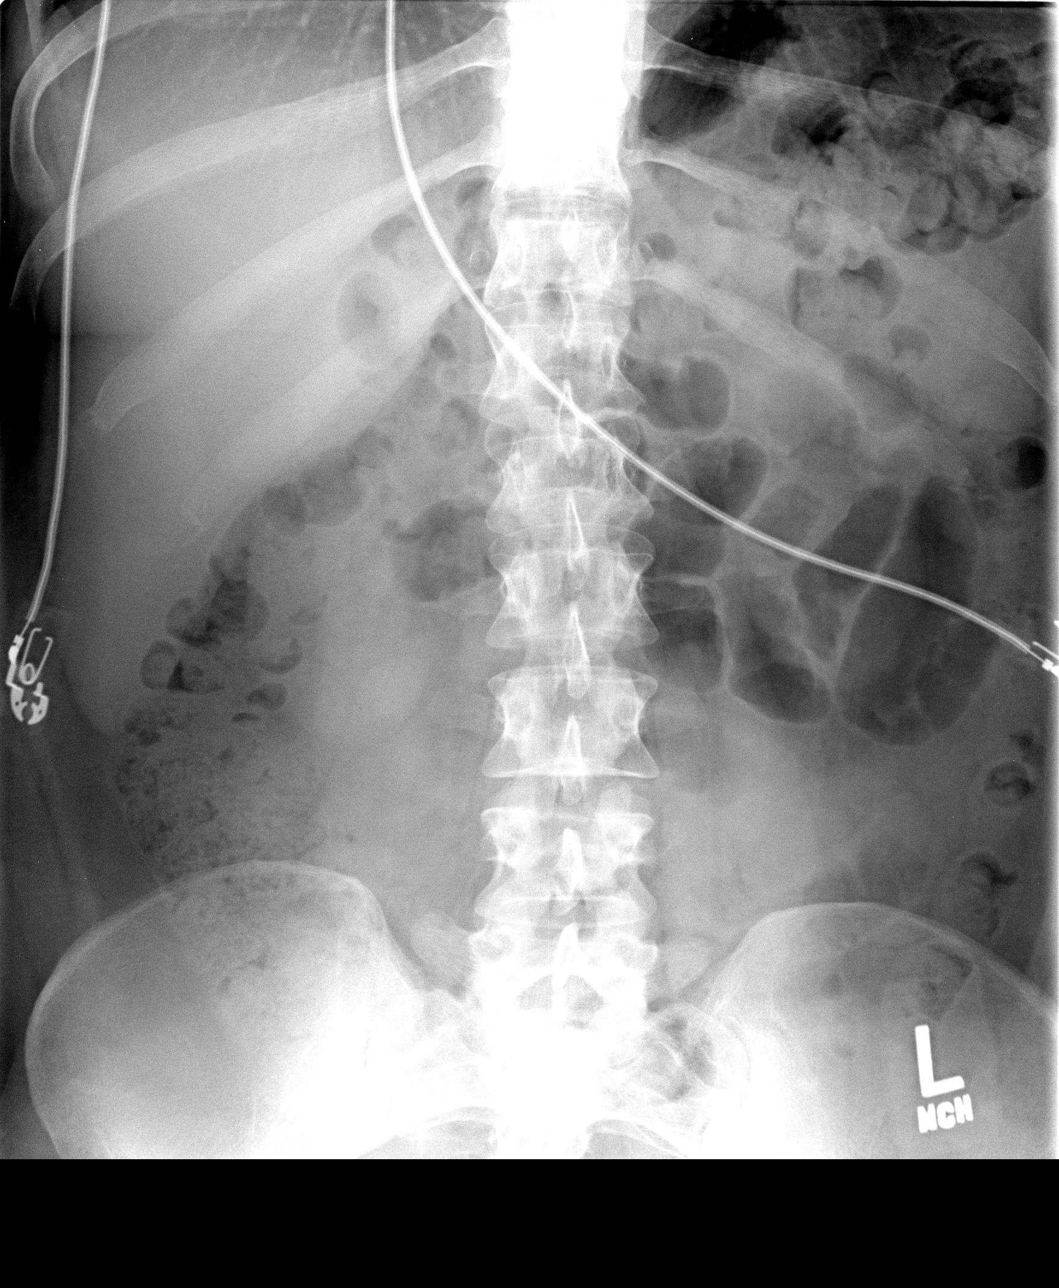

[view not recorded (4 of 4)]
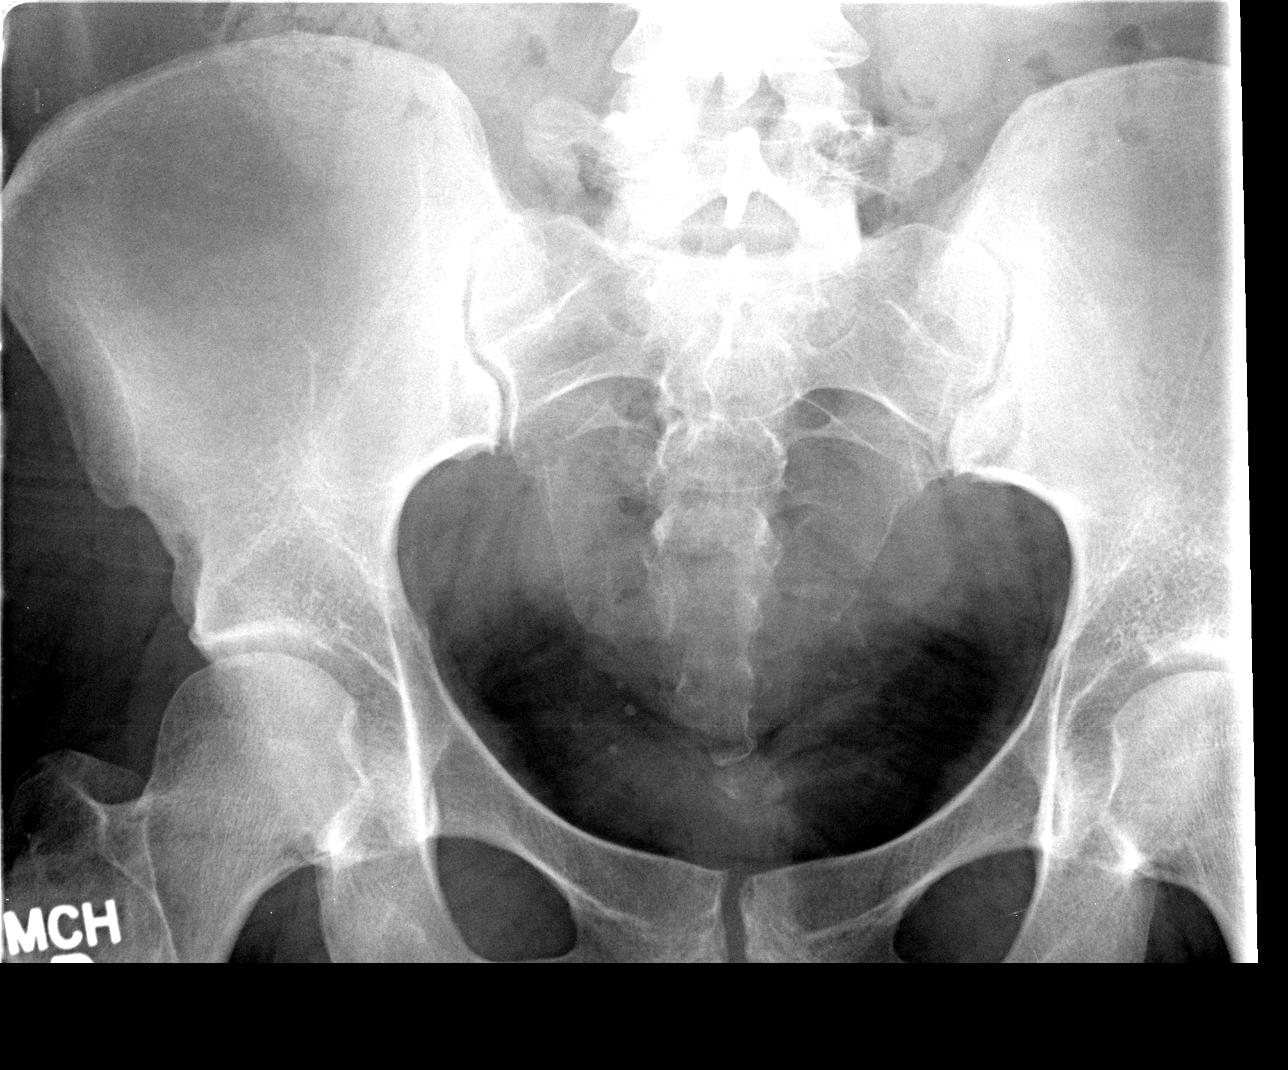

[4 of 4 positions shown; findings below may reference images not displayed]

FINDINGS: PA chest: Lungs are clear. Heart is upper normal in size with normal
pulmonary vascularity. No adenopathy.

Supine and upright abdomen: The bowel gas pattern is normal. No
obstruction or free air. There is moderate diffuse stool throughout
colon. There are apparent phleboliths in the pelvis.
IMPRESSION: Moderate stool throughout colon somewhat diffusely. Bowel gas
pattern unremarkable. No edema or consolidation.

## 2015-02-25 ENCOUNTER — Emergency Department (HOSPITAL_COMMUNITY)
Admission: EM | Admit: 2015-02-25 | Discharge: 2015-02-25 | Disposition: A | Discharge status: Home / Self Care | Payer: Self-pay | Attending: Emergency Medicine | Admitting: Emergency Medicine

## 2015-02-25 ENCOUNTER — Encounter (HOSPITAL_COMMUNITY): Payer: Self-pay | Admitting: *Deleted

## 2015-02-25 ENCOUNTER — Emergency Department (HOSPITAL_COMMUNITY): Payer: PRIVATE HEALTH INSURANCE | Admitting: Anesthesiology

## 2015-02-25 ENCOUNTER — Encounter (HOSPITAL_COMMUNITY)
Admission: EM | Disposition: A | Discharge status: Home / Self Care | Payer: Self-pay | Source: Home / Self Care | Attending: Emergency Medicine

## 2015-02-25 ENCOUNTER — Emergency Department (HOSPITAL_COMMUNITY): Payer: Self-pay | Admitting: Anesthesiology

## 2015-02-25 DIAGNOSIS — Z9049 Acquired absence of other specified parts of digestive tract: Secondary | ICD-10-CM | POA: Insufficient documentation

## 2015-02-25 DIAGNOSIS — N201 Calculus of ureter: Secondary | ICD-10-CM | POA: Insufficient documentation

## 2015-02-25 DIAGNOSIS — K219 Gastro-esophageal reflux disease without esophagitis: Secondary | ICD-10-CM | POA: Insufficient documentation

## 2015-02-25 DIAGNOSIS — F17210 Nicotine dependence, cigarettes, uncomplicated: Secondary | ICD-10-CM | POA: Insufficient documentation

## 2015-02-25 DIAGNOSIS — Z87442 Personal history of urinary calculi: Secondary | ICD-10-CM | POA: Insufficient documentation

## 2015-02-25 DIAGNOSIS — Z85820 Personal history of malignant melanoma of skin: Secondary | ICD-10-CM | POA: Insufficient documentation

## 2015-02-25 DIAGNOSIS — N200 Calculus of kidney: Secondary | ICD-10-CM

## 2015-02-25 HISTORY — PX: CYSTOSCOPY/URETEROSCOPY/HOLMIUM LASER/STENT PLACEMENT: SHX6546

## 2015-02-25 SURGERY — CYSTOSCOPY/URETEROSCOPY/HOLMIUM LASER/STENT PLACEMENT
Anesthesia: General | Site: Ureter | Laterality: Left

## 2015-02-25 MED ORDER — DEXAMETHASONE SODIUM PHOSPHATE 10 MG/ML IJ SOLN
INTRAMUSCULAR | Status: DC | PRN
  Administered 2015-02-25: 10 mg via INTRAVENOUS

## 2015-02-25 MED ORDER — SODIUM CHLORIDE 0.9 % IR SOLN
Status: DC | PRN
  Administered 2015-02-25: 3000 mL

## 2015-02-25 MED ORDER — HYDROMORPHONE HCL 2 MG/ML IJ SOLN
2.0000 mg | Freq: Once | INTRAMUSCULAR | Status: AC
  Administered 2015-02-25: 2 mg via INTRAVENOUS
  Filled 2015-02-25: qty 1

## 2015-02-25 MED ORDER — PROPOFOL 10 MG/ML IV BOLUS
INTRAVENOUS | Status: AC
  Filled 2015-02-25: qty 20

## 2015-02-25 MED ORDER — OXYCODONE-ACETAMINOPHEN 5-325 MG PO TABS: 1.0000 | ORAL_TABLET | Freq: Four times a day (QID) | ORAL | Status: DC | PRN

## 2015-02-25 MED ORDER — CEFAZOLIN SODIUM-DEXTROSE 2-3 GM-% IV SOLR
INTRAVENOUS | Status: DC | PRN
  Administered 2015-02-25: 2 g via INTRAVENOUS

## 2015-02-25 MED ORDER — PROMETHAZINE HCL 25 MG/ML IJ SOLN: 6.2500 mg | INTRAMUSCULAR | Status: DC | PRN

## 2015-02-25 MED ORDER — OXYCODONE-ACETAMINOPHEN 5-325 MG PO TABS
1.0000 | ORAL_TABLET | Freq: Once | ORAL | Status: AC
  Administered 2015-02-25: 1 via ORAL

## 2015-02-25 MED ORDER — LIDOCAINE HCL (CARDIAC) 20 MG/ML IV SOLN
INTRAVENOUS | Status: AC
Start: 2015-02-25 — End: 2015-02-25
  Filled 2015-02-25: qty 5

## 2015-02-25 MED ORDER — MIDAZOLAM HCL 2 MG/2ML IJ SOLN
INTRAMUSCULAR | Status: AC
  Filled 2015-02-25: qty 2

## 2015-02-25 MED ORDER — LIDOCAINE HCL (CARDIAC) 20 MG/ML IV SOLN
INTRAVENOUS | Status: DC | PRN
  Administered 2015-02-25: 50 mg via INTRAVENOUS

## 2015-02-25 MED ORDER — 0.9 % SODIUM CHLORIDE (POUR BTL) OPTIME
TOPICAL | Status: DC | PRN
  Administered 2015-02-25: 1000 mL

## 2015-02-25 MED ORDER — HYDROMORPHONE HCL 1 MG/ML IJ SOLN
INTRAMUSCULAR | Status: AC
  Filled 2015-02-25: qty 1

## 2015-02-25 MED ORDER — PROPOFOL 10 MG/ML IV BOLUS
INTRAVENOUS | Status: DC | PRN
  Administered 2015-02-25: 150 mg via INTRAVENOUS

## 2015-02-25 MED ORDER — LACTATED RINGERS IV SOLN: INTRAVENOUS | Status: DC

## 2015-02-25 MED ORDER — OXYCODONE-ACETAMINOPHEN 5-325 MG PO TABS
ORAL_TABLET | ORAL | Status: AC
  Filled 2015-02-25: qty 1

## 2015-02-25 MED ORDER — MIDAZOLAM HCL 5 MG/5ML IJ SOLN
INTRAMUSCULAR | Status: DC | PRN
  Administered 2015-02-25: 2 mg via INTRAVENOUS

## 2015-02-25 MED ORDER — HYDROMORPHONE HCL 2 MG/ML IJ SOLN
2.0000 mg | INTRAMUSCULAR | Status: DC | PRN
  Administered 2015-02-25 (×3): 2 mg via INTRAVENOUS
  Filled 2015-02-25 (×3): qty 1

## 2015-02-25 MED ORDER — DEXTROSE-NACL 5-0.45 % IV SOLN
INTRAVENOUS | Status: DC
  Administered 2015-02-25: 14:00:00 via INTRAVENOUS

## 2015-02-25 MED ORDER — PROMETHAZINE HCL 25 MG/ML IJ SOLN
12.5000 mg | Freq: Four times a day (QID) | INTRAMUSCULAR | Status: DC | PRN
  Administered 2015-02-25: 12.5 mg via INTRAVENOUS
  Filled 2015-02-25: qty 1

## 2015-02-25 MED ORDER — HYDROMORPHONE HCL 1 MG/ML IJ SOLN
0.2500 mg | INTRAMUSCULAR | Status: DC | PRN
  Administered 2015-02-25 (×2): 0.25 mg via INTRAVENOUS

## 2015-02-25 MED ORDER — DEXAMETHASONE SODIUM PHOSPHATE 10 MG/ML IJ SOLN
INTRAMUSCULAR | Status: AC
  Filled 2015-02-25: qty 1

## 2015-02-25 MED ORDER — LACTATED RINGERS IV SOLN
INTRAVENOUS | Status: DC | PRN
  Administered 2015-02-25: 19:00:00 via INTRAVENOUS

## 2015-02-25 MED ORDER — SUCCINYLCHOLINE CHLORIDE 20 MG/ML IJ SOLN
INTRAMUSCULAR | Status: DC | PRN
  Administered 2015-02-25: 180 mg via INTRAVENOUS

## 2015-02-25 MED ORDER — ONDANSETRON HCL 4 MG/2ML IJ SOLN
4.0000 mg | Freq: Once | INTRAMUSCULAR | Status: AC
  Administered 2015-02-25: 4 mg via INTRAVENOUS
  Filled 2015-02-25: qty 2

## 2015-02-25 MED ORDER — CEFAZOLIN SODIUM-DEXTROSE 2-3 GM-% IV SOLR
INTRAVENOUS | Status: AC
  Filled 2015-02-25: qty 50

## 2015-02-25 MED ORDER — KETOROLAC TROMETHAMINE 30 MG/ML IJ SOLN
INTRAMUSCULAR | Status: DC | PRN
  Administered 2015-02-25: 30 mg via INTRAVENOUS

## 2015-02-25 MED ORDER — IOHEXOL 300 MG/ML  SOLN
INTRAMUSCULAR | Status: DC | PRN
  Administered 2015-02-25: 1 mL

## 2015-02-25 MED ORDER — FENTANYL CITRATE (PF) 100 MCG/2ML IJ SOLN
INTRAMUSCULAR | Status: DC | PRN
  Administered 2015-02-25: 100 ug via INTRAVENOUS

## 2015-02-25 MED ORDER — ONDANSETRON HCL 4 MG/2ML IJ SOLN
INTRAMUSCULAR | Status: AC
  Filled 2015-02-25: qty 2

## 2015-02-25 MED ORDER — FENTANYL CITRATE (PF) 100 MCG/2ML IJ SOLN
INTRAMUSCULAR | Status: AC
  Filled 2015-02-25: qty 2

## 2015-02-25 SURGICAL SUPPLY — 20 items
BAG URINE DRAINAGE (UROLOGICAL SUPPLIES) ×3 IMPLANT
BASKET DAKOTA 1.9FR 11X120 (BASKET) ×2 IMPLANT
BASKET ZERO TIP NITINOL 2.4FR (BASKET) ×2 IMPLANT
BSKT STON RTRVL ZERO TP 2.4FR (BASKET) ×1
CATH INTERMIT  6FR 70CM (CATHETERS) ×2 IMPLANT
CLOTH BEACON ORANGE TIMEOUT ST (SAFETY) ×3 IMPLANT
DRSG TEGADERM 2-3/8X2-3/4 SM (GAUZE/BANDAGES/DRESSINGS) ×2 IMPLANT
FIBER LASER FLEXIVA 1000 (UROLOGICAL SUPPLIES) IMPLANT
FIBER LASER FLEXIVA 200 (UROLOGICAL SUPPLIES) IMPLANT
FIBER LASER FLEXIVA 365 (UROLOGICAL SUPPLIES) ×2 IMPLANT
FIBER LASER FLEXIVA 550 (UROLOGICAL SUPPLIES) IMPLANT
FIBER LASER TRAC TIP (UROLOGICAL SUPPLIES) IMPLANT
GLOVE BIOGEL M STRL SZ7.5 (GLOVE) ×3 IMPLANT
GOWN STRL REUS W/TWL XL LVL3 (GOWN DISPOSABLE) IMPLANT
GUIDEWIRE .038 (WIRE) IMPLANT
GUIDEWIRE ANG ZIPWIRE 038X150 (WIRE) IMPLANT
GUIDEWIRE STR DUAL SENSOR (WIRE) ×3 IMPLANT
IV NS IRRIG 3000ML ARTHROMATIC (IV SOLUTION) ×3 IMPLANT
PACK CYSTO (CUSTOM PROCEDURE TRAY) ×3 IMPLANT
STENT CONTOUR 6FRX24X.038 (STENTS) ×2 IMPLANT

## 2015-02-25 NOTE — Transfer of Care (Signed)
Immediate Anesthesia Transfer of Care Note  Patient: Crystal Zamora  Procedure(s) Performed: Procedure(s): CYSTOSCOPY LEFT RETROGRADE PYELOGRAM, HOLMIUM LASER APPLICATION WITH STONE BASKETRY AND LEFT URETERAL STENT PLACEMENT  (Left)  Patient Location: PACU  Anesthesia Type:General  Level of Consciousness:  sedated, patient cooperative and responds to stimulation  Airway & Oxygen Therapy:Patient Spontanous Breathing and Patient connected to face mask oxgen  Post-op Assessment:  Report given to PACU RN and Post -op Vital signs reviewed and stable  Post vital signs:  Reviewed and stable  Last Vitals:  Filed Vitals:   02/25/15 1416 02/25/15 1700  BP: 108/64 101/64  Pulse: 55 62  Temp:    Resp: 16 16    Complications: No apparent anesthesia complications

## 2015-02-25 NOTE — Anesthesia Preprocedure Evaluation (Addendum)
Anesthesia Evaluation  Patient identified by MRN, date of birth, ID band Patient awake    Reviewed: Allergy & Precautions, H&P , NPO status , Patient's Chart, lab work & pertinent test results  Airway Mallampati: I  TM Distance: >3 FB Neck ROM: Full    Dental  (+) Teeth Intact   Pulmonary Current Smoker,    breath sounds clear to auscultation       Cardiovascular negative cardio ROS   Rhythm:Regular Rate:Normal     Neuro/Psych    GI/Hepatic GERD  Medicated,  Endo/Other    Renal/GU Renal disease     Musculoskeletal   Abdominal   Peds  Hematology   Anesthesia Other Findings   Reproductive/Obstetrics                            Anesthesia Physical  Anesthesia Plan  ASA: II  Anesthesia Plan: General   Post-op Pain Management:    Induction: Intravenous  Airway Management Planned: Oral ETT  Additional Equipment:   Intra-op Plan:   Post-operative Plan: Extubation in OR  Informed Consent: I have reviewed the patients History and Physical, chart, labs and discussed the procedure including the risks, benefits and alternatives for the proposed anesthesia with the patient or authorized representative who has indicated his/her understanding and acceptance.     Plan Discussed with: Anesthesiologist, CRNA and Surgeon  Anesthesia Plan Comments:        Anesthesia Quick Evaluation

## 2015-02-25 NOTE — Interval H&P Note (Signed)
History and Physical Interval Note:  02/25/2015 7:28 PM  Crystal Zamora  has presented today for surgery, with the diagnosis of LEFT URETERAL OBSTRUCTION  The various methods of treatment have been discussed with the patient and family. After consideration of risks, benefits and other options for treatment, the patient has consented to  Procedure(s): CYSTOSCOPY LEFT RETROGRADE WITH LEFT STENT PLACEMENT AND POSSIBLE [place] (Left) as a surgical intervention .  The patient's history has been reviewed, patient examined, no change in status, stable for surgery.  I have reviewed the patient's chart and labs.  Questions were answered to the patient's satisfaction.     Ames Hoban S

## 2015-02-25 NOTE — Consult Note (Signed)
Urology Consult   Requesting Provider:  Leonard Schwartz, MD  Reason for Consult: LEFT Nephrolithiasis  HPI:  30yF  we are seeing in consultation at the request of Dr. Audie Pinto for management of nephrolithiasis. Patient has a prior history of nephrolithiasis once in the past (age 30) manage with stent. No ongoing urologic care. History of endometriosis which is well controlled.  She presented to the [facility] ED with sudden onset LEFT flank pain 02/23/15 and a CT scan was performed which identified a 4-5 mm proximal LEFT ureteral stone just above the pelvic brim with hounsfield units of 200-500. Not clearly identified on scout imaging. She was discharged home on medical expulsive therapy. Urinalysis at that time was without infectious parameters   She re-presented to the [facility] ED earlier this morning with persistent and refractory pain minimally relieved by Toradol, IV Dilaudid and Fentanyl. A KUB was obtained which demonstrated a poorly visualized 6 mm calculus in the LEFT proximal ureter 1 cm above the iliac crest. She was transferred to the [facility] ED for presumed need for urologic intervention. She has had associated nausea and emesis and has been unable to tolerate PO intake since 10 pm last night. She reports low grade temp to 101 on Saturday evening but not since.  Review of her outside labs reveal no leukocytosis, serum Cr of 0.6, non-infectious urine parameters (nitrite negative, LE negative, no bacteriuria).  Pain has been better controlled with IV dilaudid, toradol and fentanyl received in transit from [place].  The patient has no history of diabetes.  She denies a history of voiding or storage urinary symptoms, hematuria, UTIs, STDs, urolithiasis, GU malignancy/trauma/surgery.  Past Medical History  Diagnosis Date  . Endometriosis   . Endometriosis   . GERD (gastroesophageal reflux disease)   . Chronic kidney disease     stones   . Cancer ([place])     skin melanoma  ,  4  removed stomach and back    Past Surgical History  Procedure Laterality Date  . Laporscopic      r/o endometriosis  . Cesarean section    . Cholecystectomy N/A 02/24/2013    Procedure: LAPAROSCOPIC CHOLECYSTECTOMY;  Surgeon: Jamesetta So, MD;  Location: [facility];  Service: General;  Laterality: N/A;    Current Hospital Medications:  Home Meds:    Medication List    ASK your doctor about these medications        ondansetron 4 MG tablet  Commonly known as:  ZOFRAN  Take 4 mg by mouth every 6 (six) hours as needed for nausea or vomiting.     oxyCODONE-acetaminophen 5-325 MG tablet  Commonly known as:  PERCOCET/ROXICET  Take 1 tablet by mouth every 4 (four) hours as needed for moderate pain or severe pain.     tamsulosin 0.4 MG Caps capsule  Commonly known as:  FLOMAX  Take 0.4 mg by mouth daily. Started 01/28 for 7 days        Scheduled Meds:  Continuous Infusions:  PRN Meds:.HYDROmorphone (DILAUDID) injection  Allergies: No Known Allergies  No family history on file.  Social History:  reports that she has been smoking.  She does not have any smokeless tobacco history on file. She reports that she does not drink alcohol or use illicit drugs.  ROS: A complete review of systems was performed.  All systems are negative except for pertinent findings as noted.  Physical Exam:  Vital signs in last 24 hours: Temp:  [97.3 F (36.3 C)] 97.3  F (36.3 C) (01/30 1125) Pulse Rate:  [55-70] 55 (01/30 1105) Resp:  [18] 18 (01/30 1105) BP: (150)/(100) 150/100 mmHg (01/30 1056) SpO2:  [94 %-97 %] 97 % (01/30 1105) Constitutional:  Alert and oriented, No acute distress Cardiovascular: Regular rate and rhythm, No JVD Respiratory: Normal respiratory effort, Lungs clear bilaterally GI: Abdomen is soft, nontender, nondistended, no abdominal masses GU: No CVA tenderness Lymphatic: No lymphadenopathy Neurologic: Grossly intact, no focal deficits Psychiatric: Normal mood and  affect  Laboratory Data:  No results for input(s): WBC, HGB, HCT, PLT in the last 72 hours.  No results for input(s): NA, K, CL, GLUCOSE, BUN, CALCIUM, CREATININE in the last 72 hours.  Invalid input(s): CO3   No results found for this or any previous visit (from the past 24 hour(s)). No results found for this or any previous visit (from the past 240 hour(s)).  Renal Function: No results for input(s): CREATININE in the last 168 hours. CrCl cannot be calculated (Unknown ideal weight.).  Radiologic Imaging: No results found.  I independently reviewed the above imaging studies.  CT 02/23/15 with 4.5 mm proximal ureteral stone KUB 02/25/15 with 6 mm proximal ureteral stone  Impression/Recommendations: 30yF with obstructing 6 mm proximal ureteral stone in the setting of  intractable pain with non-infectious urinary parameters  We had a long discussion regarding these findings and our treatment options at this time. We discussed that the standard of care would be to proceed with decompression of the collecting system with placement of an indwelling ureteral stent on the obstructed side with possible attempt at stone extraction if there was no evidence of active UTI and if we were able to navigate her ureter. We discussed that in the setting of concern for active infection we would not recommend definitive stone management via USE at this time given the risk of urosepsis. We discussed that we would plan to leave the stent indwelling with plans to come back in 1-2 weeks for definitive stone management via URS with laser lithotripsy. Risks and benefits of various options in terms of stone management were discussed. Risks of ureteroscopic intervention were discussed included but not limited to bleeding, infection, injury to the bladder/kidney/ureter, need for second procedure, ureteral stricture, DVT, and untoward risk of anesthesia. The patient understands these risks and is agreeable to proceeding.    We also discussed that although it is unlikely, it is possible that we would be unable to safely/effectively pass a stent from below and would need to obtain percutaneous drainage via a percutaneous nephrostomy tube with the assistance of our colleagues in [place]. All questions were answered to the patient's satisfaction. Informed, written consent was obtained for "cystourethroscopy, LEFT retrograde pyelogram and stent placement, possible ureteroscopic stone extraction with laser lithotripsy".   We will plan to proceed with LEFT ureteral stent placement and possible laser lithotripsy.  -Patient to remain NPO in preparation for the operating room -Flomax 0.4 mg once daily for ureteral spasm -Empiric antibiotics with Ciprofloxacin 500 mg IV -Percocet 5-325 mg 2 tablets q 4-6 hours PRN pain -Morphine 2 mg for breakthrough pain -Stone strainer -Patient will likely discharge home following stent placement  Star Age 02/25/2015, 1:00 PM        I performed a history and physical examination of the patient and discussed his management with the resident.  I reviewed the resident's note and agree with the documented findings and plan of care.

## 2015-02-25 NOTE — ED Notes (Signed)
Bed: WA20 Expected date:  Expected time:  Means of arrival:  Comments: [place] ER pt

## 2015-02-25 NOTE — ED Notes (Signed)
OR contacted to get update on start time for Pt.  Estimate is 1830-1930.  Pt and family updated.

## 2015-02-25 NOTE — Op Note (Signed)
Preoperative diagnosis: left ureteral calculus  Postoperative diagnosis:  left ureteral calculus  Procedure:  1. Cystoscopy 2.  left ureteroscopy and stone removal 3. Ureteroscopic laser lithotripsy 4.  left ureteral stent placement (43f) 24cm with string 5.  left retrograde pyelography with interpretation  Surgeon: Bernestine Amass, MD  Anesthesia: General  Complications: None  Intraoperative findings:  left retrograde pyelography demonstrated a filling defect within the  left ureter consistent with the patient's known calculus without other abnormalities.  EBL: Minimal    Indication: Crystal Zamora is a 30 y.o.   patient with urolithiasis. After reviewing the management options for treatment, the patient elected to proceed with the above surgical procedure(s). We have discussed the potential benefits and risks of the procedure, side effects of the proposed treatment, the likelihood of the patient achieving the goals of the procedure, and any potential problems that might occur during the procedure or recuperation. Informed consent has been obtained.  Description of procedure:  The patient was taken to the operating room and general anesthesia was induced.  The patient was placed in the dorsal lithotomy position, prepped and draped in the usual sterile fashion, and preoperative antibiotics were administered. A preoperative time-out was performed.   Cystourethroscopy was performed. The bladder was then systematically examined in its entirety. There was no evidence for any bladder tumors, stones, or other mucosal pathology.    Attention then turned to the  left ureteral orifice and a ureteral catheter was used to intubate the ureteral orifice.  Omnipaque contrast was injected through the ureteral catheter and a retrograde pyelogram was performed with findings as dictated above.  A 0.38 sensor guidewire was then advanced up the  left ureter into the renal pelvis under fluoroscopic  guidance. The 6 Fr semirigid ureteroscope was then advanced into the ureter next to the guidewire and the calculus was identified.   The stone was then fragmented with the 365 micron holmium laser fiber on a setting of 0.5J and frequency of 5 Hz.   Stone was broken up into 15-20 fragments all no bigger than 1-2 mm.  The wire was then backloaded through the cystoscope and a ureteral stent was advance over the wire using Seldinger technique.  The stent was positioned appropriately under fluoroscopic and cystoscopic guidance.  The wire was then removed with an adequate stent curl noted in the renal pelvis as well as in the bladder.  The bladder was then emptied and the procedure ended.  The patient appeared to tolerate the procedure well and without complications.  The patient was able to be awakened and transferred to the recovery unit in satisfactory condition.

## 2015-02-25 NOTE — ED Notes (Signed)
Dr. Francina Ames bedside with Pt

## 2015-02-25 NOTE — Anesthesia Postprocedure Evaluation (Signed)
Anesthesia Post Note  Patient: Joylene Igo  Procedure(s) Performed: Procedure(s) (LRB): CYSTOSCOPY LEFT RETROGRADE PYELOGRAM, HOLMIUM LASER APPLICATION WITH STONE BASKETRY AND LEFT URETERAL STENT PLACEMENT  (Left)  Patient location during evaluation: PACU Anesthesia Type: General Level of consciousness: awake and alert Pain management: pain level controlled Vital Signs Assessment: post-procedure vital signs reviewed and stable Respiratory status: spontaneous breathing, nonlabored ventilation, respiratory function stable and patient connected to nasal cannula oxygen Cardiovascular status: blood pressure returned to baseline and stable Postop Assessment: no signs of nausea or vomiting Anesthetic complications: no    Last Vitals:  Filed Vitals:   02/25/15 2030 02/25/15 2045  BP: 113/78 125/88  Pulse: 50 47  Temp:    Resp: 12 13    Last Pain:  Filed Vitals:   02/25/15 2058  PainSc: 5                  Zenaida Deed

## 2015-02-25 NOTE — ED Notes (Signed)
EMS IV site in R anticube (20g) DC'd due to concern of infiltration

## 2015-02-25 NOTE — ED Notes (Signed)
Pt reports need to urinate, unable to pass urine.  IV fluids DC'd by RN

## 2015-02-25 NOTE — ED Notes (Signed)
Pt transported from [facility] ER.  Per [facility], pt was dx with L kidney stones Friday.  Reports pain is getting worse.  Reporting nurse reports obstructing L kidney stones.  Pt received Dilaudid, Zofran, and toradol at 0905.  And Fentanyl 140mcg en route.  Pt is A&Ox 4.  Rates pain 3/10.

## 2015-02-25 NOTE — H&P (View-Only) (Signed)
Urology Consult   Requesting Provider:  Leonard Schwartz, MD  Reason for Consult: LEFT Nephrolithiasis  HPI:  29yF  we are seeing in consultation at the request of Dr. Audie Pinto for management of nephrolithiasis. Patient has a prior history of nephrolithiasis once in the past (age 30) manage with stent. No ongoing urologic care. History of endometriosis which is well controlled.  She presented to the [facility] ED with sudden onset LEFT flank pain 02/23/15 and a CT scan was performed which identified a 4-5 mm proximal LEFT ureteral stone just above the pelvic brim with hounsfield units of 200-500. Not clearly identified on scout imaging. She was discharged home on medical expulsive therapy. Urinalysis at that time was without infectious parameters   She re-presented to the [facility] ED earlier this morning with persistent and refractory pain minimally relieved by Toradol, IV Dilaudid and Fentanyl. A KUB was obtained which demonstrated a poorly visualized 6 mm calculus in the LEFT proximal ureter 1 cm above the iliac crest. She was transferred to the [facility] ED for presumed need for urologic intervention. She has had associated nausea and emesis and has been unable to tolerate PO intake since 10 pm last night. She reports low grade temp to 101 on Saturday evening but not since.  Review of her outside labs reveal no leukocytosis, serum Cr of 0.6, non-infectious urine parameters (nitrite negative, LE negative, no bacteriuria).  Pain has been better controlled with IV dilaudid, toradol and fentanyl received in transit from [place].  The patient has no history of diabetes.  She denies a history of voiding or storage urinary symptoms, hematuria, UTIs, STDs, urolithiasis, GU malignancy/trauma/surgery.  Past Medical History  Diagnosis Date  . Endometriosis   . Endometriosis   . GERD (gastroesophageal reflux disease)   . Chronic kidney disease     stones   . Cancer ([place])     skin melanoma  ,  4  removed stomach and back    Past Surgical History  Procedure Laterality Date  . Laporscopic      r/o endometriosis  . Cesarean section    . Cholecystectomy N/A 02/24/2013    Procedure: LAPAROSCOPIC CHOLECYSTECTOMY;  Surgeon: Jamesetta So, MD;  Location: [facility];  Service: General;  Laterality: N/A;    Current Hospital Medications:  Home Meds:    Medication List    ASK your doctor about these medications        ondansetron 4 MG tablet  Commonly known as:  ZOFRAN  Take 4 mg by mouth every 6 (six) hours as needed for nausea or vomiting.     oxyCODONE-acetaminophen 5-325 MG tablet  Commonly known as:  PERCOCET/ROXICET  Take 1 tablet by mouth every 4 (four) hours as needed for moderate pain or severe pain.     tamsulosin 0.4 MG Caps capsule  Commonly known as:  FLOMAX  Take 0.4 mg by mouth daily. Started 01/28 for 7 days        Scheduled Meds:  Continuous Infusions:  PRN Meds:.HYDROmorphone (DILAUDID) injection  Allergies: No Known Allergies  No family history on file.  Social History:  reports that she has been smoking.  She does not have any smokeless tobacco history on file. She reports that she does not drink alcohol or use illicit drugs.  ROS: A complete review of systems was performed.  All systems are negative except for pertinent findings as noted.  Physical Exam:  Vital signs in last 24 hours: Temp:  [97.3 F (36.3 C)] 97.3  F (36.3 C) (01/30 1125) Pulse Rate:  [55-70] 55 (01/30 1105) Resp:  [18] 18 (01/30 1105) BP: (150)/(100) 150/100 mmHg (01/30 1056) SpO2:  [94 %-97 %] 97 % (01/30 1105) Constitutional:  Alert and oriented, No acute distress Cardiovascular: Regular rate and rhythm, No JVD Respiratory: Normal respiratory effort, Lungs clear bilaterally GI: Abdomen is soft, nontender, nondistended, no abdominal masses GU: No CVA tenderness Lymphatic: No lymphadenopathy Neurologic: Grossly intact, no focal deficits Psychiatric: Normal mood and  affect  Laboratory Data:  No results for input(s): WBC, HGB, HCT, PLT in the last 72 hours.  No results for input(s): NA, K, CL, GLUCOSE, BUN, CALCIUM, CREATININE in the last 72 hours.  Invalid input(s): CO3   No results found for this or any previous visit (from the past 24 hour(s)). No results found for this or any previous visit (from the past 240 hour(s)).  Renal Function: No results for input(s): CREATININE in the last 168 hours. CrCl cannot be calculated (Unknown ideal weight.).  Radiologic Imaging: No results found.  I independently reviewed the above imaging studies.  CT 02/23/15 with 4.5 mm proximal ureteral stone KUB 02/25/15 with 6 mm proximal ureteral stone  Impression/Recommendations: 29yF with obstructing 6 mm proximal ureteral stone in the setting of  intractable pain with non-infectious urinary parameters  We had a long discussion regarding these findings and our treatment options at this time. We discussed that the standard of care would be to proceed with decompression of the collecting system with placement of an indwelling ureteral stent on the obstructed side with possible attempt at stone extraction if there was no evidence of active UTI and if we were able to navigate her ureter. We discussed that in the setting of concern for active infection we would not recommend definitive stone management via USE at this time given the risk of urosepsis. We discussed that we would plan to leave the stent indwelling with plans to come back in 1-2 weeks for definitive stone management via URS with laser lithotripsy. Risks and benefits of various options in terms of stone management were discussed. Risks of ureteroscopic intervention were discussed included but not limited to bleeding, infection, injury to the bladder/kidney/ureter, need for second procedure, ureteral stricture, DVT, and untoward risk of anesthesia. The patient understands these risks and is agreeable to proceeding.    We also discussed that although it is unlikely, it is possible that we would be unable to safely/effectively pass a stent from below and would need to obtain percutaneous drainage via a percutaneous nephrostomy tube with the assistance of our colleagues in [place]. All questions were answered to the patient's satisfaction. Informed, written consent was obtained for "cystourethroscopy, LEFT retrograde pyelogram and stent placement, possible ureteroscopic stone extraction with laser lithotripsy".   We will plan to proceed with LEFT ureteral stent placement and possible laser lithotripsy.  -Patient to remain NPO in preparation for the operating room -Flomax 0.4 mg once daily for ureteral spasm -Empiric antibiotics with Ciprofloxacin 500 mg IV -Percocet 5-325 mg 2 tablets q 4-6 hours PRN pain -Morphine 2 mg for breakthrough pain -Stone strainer -Patient will likely discharge home following stent placement  Star Age 02/25/2015, 1:00 PM        I performed a history and physical examination of the patient and discussed his management with the resident.  I reviewed the resident's note and agree with the documented findings and plan of care.

## 2015-02-25 NOTE — Anesthesia Procedure Notes (Addendum)

## 2015-02-25 NOTE — ED Provider Notes (Signed)
CSN: [id number]     Arrival date & time 02/25/15  1055 History   First MD Initiated Contact with Patient 02/25/15 1116     Chief Complaint  Patient presents with  . Flank Pain  . Nephrolithiasis      HPI Pt transported from [facility] ER. Per [facility], pt was dx with L kidney stones Friday. Reports pain is getting worse. Reporting nurse reports obstructing L kidney stones. Pt received Dilaudid, Zofran, and toradol at 0905. And Fentanyl 182mcg en route. Pt is A&Ox 4. Rates pain 3/10. Past Medical History  Diagnosis Date  . Endometriosis   . Endometriosis   . GERD (gastroesophageal reflux disease)   . Chronic kidney disease     stones   . Cancer ([place])     skin melanoma  ,  6 removed stomach and back   Past Surgical History  Procedure Laterality Date  . Laporscopic      r/o endometriosis  . Cesarean section    . Cholecystectomy N/A 02/24/2013    Procedure: LAPAROSCOPIC CHOLECYSTECTOMY;  Surgeon: Jamesetta So, MD;  Location: [facility];  Service: General;  Laterality: N/A;   No family history on file. Social History  Substance Use Topics  . Smoking status: Current Every Day Smoker -- 1.00 packs/day  . Smokeless tobacco: None  . Alcohol Use: No   OB History    Gravida Para Term Preterm AB TAB SAB Ectopic Multiple Living   3    2  2         Review of Systems  Unable to perform ROS: Acuity of condition      Allergies  Review of patient's allergies indicates no known allergies.  Home Medications   Prior to Admission medications   Medication Sig Start Date End Date Taking? Authorizing Provider  ondansetron (ZOFRAN) 4 MG tablet Take 4 mg by mouth every 6 (six) hours as needed for nausea or vomiting.   Yes Historical Provider, MD  oxyCODONE-acetaminophen (PERCOCET/ROXICET) 5-325 MG tablet Take 1 tablet by mouth every 4 (four) hours as needed for moderate pain or severe pain.   Yes Historical Provider, MD  tamsulosin (FLOMAX) 0.4 MG CAPS capsule Take 0.4 mg by mouth  daily. Started 01/28 for 7 days   Yes Historical Provider, MD   BP 150/100 mmHg  Pulse 55  Temp(Src) 97.3 F (36.3 C) (Oral)  Resp 18  SpO2 97%  LMP 02/17/2015 Physical Exam  Constitutional: She is oriented to person, place, and time. She appears well-developed and well-nourished. She appears distressed (Patient with severe left flank pain).  When I entered the room the patient was on all fours and having severe 10 out of 10 left flank pain.  HENT:  Head: Normocephalic and atraumatic.  Eyes: Pupils are equal, round, and reactive to light.  Neck: Normal range of motion.  Cardiovascular: Normal rate and intact distal pulses.   Pulmonary/Chest: No respiratory distress.  Abdominal: Normal appearance. She exhibits no distension. There is no tenderness. There is no rebound.  Musculoskeletal: Normal range of motion.  Neurological: She is alert and oriented to person, place, and time. No cranial nerve deficit.  Skin: Skin is warm and dry. No rash noted.  Psychiatric: She has a normal mood and affect. Her behavior is normal.  Nursing note and vitals reviewed.   ED Course  Procedures (including critical care time) Medications  HYDROmorphone (DILAUDID) injection 2 mg (2 mg Intravenous Given 02/25/15 1136)  ondansetron (ZOFRAN) injection 4 mg (4 mg Intravenous Given 02/25/15  1136)  HYDROmorphone (DILAUDID) injection 2 mg (2 mg Intravenous Given 02/25/15 1210)    Labs Review Labs Reviewed - No data to display Labs and x-ray had been done in [place] prior to transfer to [place] [facility].  They were reviewed by myself and the urology PA. Patient was seen and evaluated by urology plan on taking the patient for lithotripsy and possible stent.   MDM   Final diagnoses:  Nephrolithiasis        Leonard Schwartz, MD 02/25/15 1350

## 2015-02-25 NOTE — Discharge Instructions (Signed)
DISCHARGE INSTRUCTIONS FOR KIDNEY STONES OR URETERAL STENT  MEDICATIONS:   1. DO NOT RESUME YOUR ASPIRIN, or any other medicines like ibuprofen, motrin, excedrin, advil, aleve, vitamin E, fish oil as these can all cause bleeding x 7 days.  2. Resume all your other meds from home - except do not take any other pain meds that you may have at home.  ACTIVITY 1. No strenuous activity x 1week 2. No driving while on narcotic pain medications 3. Drink plenty of water 4. Continue to walk at home - you can still get blood clots when you are at home, so keep active, but don't over do it. 5. May return to work in 3 days.  BATHING 1. You can shower and we recommend daily showers  2. If you have a string coming from your urethra:  The stent string is attached to your ureteral stent.  Do not pull on this.   SIGNS/SYMPTOMS TO CALL: 1. Please call us if you have a fever greater than 101.5, uncontrolled  nausea/vomiting, uncontrolled pain, dizziness, unable to urinate, bloody urine, chest pain, shortness of breath, leg swelling, leg pain, redness around wound, drainage from wound, or any other concerns or questions.  You can reach [place] at [phone].  1. FOLLOW-UP he should call for a follow-up appointment at the [facility] urology 2. If you have a string attached to your stent, you may remove it on Thursday  To do this, pull the string until the stent is completely removed.  You may feel an odd sensation in your back. 3.  4.

## 2015-02-25 NOTE — Interval H&P Note (Signed)
History and Physical Interval Note:  02/25/2015 7:26 PM  Crystal Zamora  has presented today for surgery, with the diagnosis of LEFT URETERAL OBSTRUCTION  The various methods of treatment have been discussed with the patient and family. After consideration of risks, benefits and other options for treatment, the patient has consented to  Procedure(s): CYSTOSCOPY LEFT RETROGRADE WITH LEFT STENT PLACEMENT AND POSSIBLE [place] (Left) as a surgical intervention .  The patient's history has been reviewed, patient examined, no change in status, stable for surgery.  I have reviewed the patient's chart and labs.  Questions were answered to the patient's satisfaction.     [name]

## 2015-02-26 ENCOUNTER — Encounter (HOSPITAL_COMMUNITY): Payer: Self-pay | Admitting: Urology

## 2015-02-26 NOTE — Progress Notes (Signed)
Dilaudid .5 mg wasted in sink witnessed per Erenest Blank

## 2015-11-30 ENCOUNTER — Encounter (HOSPITAL_COMMUNITY): Payer: Self-pay | Admitting: Emergency Medicine

## 2015-11-30 ENCOUNTER — Emergency Department (HOSPITAL_COMMUNITY)
Admission: EM | Admit: 2015-11-30 | Discharge: 2015-11-30 | Disposition: A | Discharge status: Home / Self Care | Payer: PRIVATE HEALTH INSURANCE | Attending: Emergency Medicine | Admitting: Emergency Medicine

## 2015-11-30 DIAGNOSIS — Z79899 Other long term (current) drug therapy: Secondary | ICD-10-CM | POA: Insufficient documentation

## 2015-11-30 DIAGNOSIS — Z791 Long term (current) use of non-steroidal anti-inflammatories (NSAID): Secondary | ICD-10-CM | POA: Insufficient documentation

## 2015-11-30 DIAGNOSIS — Z85828 Personal history of other malignant neoplasm of skin: Secondary | ICD-10-CM | POA: Insufficient documentation

## 2015-11-30 DIAGNOSIS — K0889 Other specified disorders of teeth and supporting structures: Secondary | ICD-10-CM | POA: Insufficient documentation

## 2015-11-30 DIAGNOSIS — F17210 Nicotine dependence, cigarettes, uncomplicated: Secondary | ICD-10-CM | POA: Insufficient documentation

## 2015-11-30 DIAGNOSIS — Z792 Long term (current) use of antibiotics: Secondary | ICD-10-CM | POA: Insufficient documentation

## 2015-11-30 DIAGNOSIS — N189 Chronic kidney disease, unspecified: Secondary | ICD-10-CM | POA: Insufficient documentation

## 2015-11-30 LAB — COMPREHENSIVE METABOLIC PANEL
ALBUMIN: 3.9 g/dL (ref 3.5–5.0)
ALK PHOS: 59 U/L (ref 38–126)
ALT: 26 U/L (ref 14–54)
AST: 25 U/L (ref 15–41)
Anion gap: 4 — ABNORMAL LOW (ref 5–15)
BILIRUBIN TOTAL: 0.4 mg/dL (ref 0.3–1.2)
BUN: 14 mg/dL (ref 6–20)
CALCIUM: 8.6 mg/dL — AB (ref 8.9–10.3)
CO2: 26 mmol/L (ref 22–32)
CREATININE: 0.86 mg/dL (ref 0.44–1.00)
Chloride: 108 mmol/L (ref 101–111)
GFR calc Af Amer: >60 mL/min (ref >60)
GFR calc non Af Amer: >60 mL/min (ref >60)
GLUCOSE: 91 mg/dL (ref 65–99)
POTASSIUM: 4 mmol/L (ref 3.5–5.1)
Sodium: 138 mmol/L (ref 135–145)
TOTAL PROTEIN: 6.5 g/dL (ref 6.5–8.1)

## 2015-11-30 LAB — CBC WITH DIFFERENTIAL/PLATELET
BASOS ABS: 0.1 10*3/uL (ref 0.0–0.1)
BASOS PCT: 1 %
Eosinophils Absolute: 0.3 10*3/uL (ref 0.0–0.7)
Eosinophils Relative: 3 %
HEMATOCRIT: 42.2 % (ref 36.0–46.0)
HEMOGLOBIN: 14.1 g/dL (ref 12.0–15.0)
LYMPHS PCT: 38 %
Lymphs Abs: 3.7 10*3/uL (ref 0.7–4.0)
MCH: 31 pg (ref 26.0–34.0)
MCHC: 33.4 g/dL (ref 30.0–36.0)
MCV: 92.7 fL (ref 78.0–100.0)
Monocytes Absolute: 0.8 10*3/uL (ref 0.1–1.0)
Monocytes Relative: 8 %
NEUTROS ABS: 4.8 10*3/uL (ref 1.7–7.7)
NEUTROS PCT: 50 %
Platelets: 283 10*3/uL (ref 150–400)
RBC: 4.55 MIL/uL (ref 3.87–5.11)
RDW: 12.4 % (ref 11.5–15.5)
WBC: 9.7 10*3/uL (ref 4.0–10.5)

## 2015-11-30 MED ORDER — OXYCODONE-ACETAMINOPHEN 5-325 MG PO TABS
1.0000 | ORAL_TABLET | Freq: Once | ORAL | Status: AC
  Administered 2015-11-30: 1 via ORAL
  Filled 2015-11-30: qty 1

## 2015-11-30 MED ORDER — OXYCODONE-ACETAMINOPHEN 5-325 MG PO TABS: 1.0000 | ORAL_TABLET | ORAL | 0 refills | Status: DC | PRN

## 2015-11-30 MED ORDER — ONDANSETRON 4 MG PO TBDP
4.0000 mg | ORAL_TABLET | Freq: Once | ORAL | Status: AC
  Administered 2015-11-30: 4 mg via ORAL
  Filled 2015-11-30: qty 1

## 2015-11-30 NOTE — Discharge Instructions (Signed)
Take percocet for breakthrough pain, do not drink alcohol, drive, care for children or do other critical tasks while taking percocet. ° °Return to the emergency room for fever, change in vision, redness to the face that rapidly spreads towards the eye, nausea or vomiting, difficulty swallowing or shortness of breath. °  °Apply warm compresses to jaw throughout the day.  ° °Take your antibiotics as directed and to the end of the course. ° °Followup with a dentist is very important for ongoing evaluation and management of recurrent dental pain. Return to emergency department for emergent changing or worsening symptoms." ° °Low-cost dental clinic: °**David  [facility]  at [phone]**  ° °You may also call [phone] ° °Dental Assistance °If the dentist on-call cannot see you, please use the resources below: ° ° °Patients with Medicaid: [facility] °[address], [phone] °[address], [phone] ° °If unable to pay, or uninsured, contact [facility] ([phone]) or [facility] ([phone] in [place], [phone] in [place]) to become qualified for the adult dental clinic ° °Other Low-Cost Community Dental Services: °[facility]- [address], [place], [place], [address] °   [phone], Ext. 123 °   2nd and 4th Thursday of the month at 6:30am °   10 clients each day by appointment, can sometimes see walk-in     patients if someone does not show for an appointment °[facility]- [address], [place], [place], [address] °   [phone] °[facility]- [address], [place], [place], [address] °   [phone] ° °[facility]- [phone] °[facility]- [phone] °[facility]- [phone] ° ° °

## 2015-11-30 NOTE — ED Provider Notes (Signed)
Emergency Department Provider Note   I have reviewed the triage vital signs and the nursing notes.   HISTORY  Chief Complaint Dental Pain   HPI Crystal Zamora is a 30 y.o. female with PMH of CKD, endometriosis, and GERD presents to the emergency department for evaluation of worsening left sided dental pain. Dates she was seen by a dentist last week who started her on ibuprofen and clindamycin. She's been taking his medication but has experienced continued fevers, severe pain, and multiple episodes of vomiting. The emesis is nonbloody. No associated diarrhea. No abdominal pain. She has some sore throat which she attributes to frequent vomiting. No difficulty breathing or sensation of throat swelling. No voice changes.    Past Medical History:  Diagnosis Date  . Cancer ([place])    skin melanoma  ,  6 removed stomach and back  . Chronic kidney disease    stones   . Endometriosis   . Endometriosis   . GERD (gastroesophageal reflux disease)     Patient Active Problem List   Diagnosis Date Noted  . Acute sinusitis 03/11/2014  . Blurry vision, right eye 03/11/2014  . Shingles rash 03/11/2014    Past Surgical History:  Procedure Laterality Date  . CESAREAN SECTION    . CHOLECYSTECTOMY N/A 02/24/2013   Procedure: LAPAROSCOPIC CHOLECYSTECTOMY;  Surgeon: Jamesetta So, MD;  Location: [facility];  Service: General;  Laterality: N/A;  . CYSTOSCOPY/URETEROSCOPY/HOLMIUM LASER/STENT PLACEMENT Left 02/25/2015   Procedure: CYSTOSCOPY LEFT RETROGRADE PYELOGRAM, HOLMIUM LASER APPLICATION WITH STONE BASKETRY AND LEFT URETERAL STENT PLACEMENT ;  Surgeon: Rana Snare, MD;  Location: [facility];  Service: Urology;  Laterality: Left;  . laporscopic     r/o endometriosis      Allergies Patient has no known allergies.  Family History  Problem Relation Age of Onset  . Heart failure Father     Social History Social History  Substance Use Topics  . Smoking status: Current Every Day Smoker   Packs/day: 0.50    Types: Cigarettes  . Smokeless tobacco: Never Used  . Alcohol use No    Review of Systems  Constitutional: No fever/chills Eyes: No visual changes. ENT: No sore throat. Positive left sided dental pain.  Cardiovascular: Denies chest pain. Respiratory: Denies shortness of breath. Gastrointestinal: No abdominal pain.  No nausea, no vomiting.  No diarrhea.  No constipation. Genitourinary: Negative for dysuria. Musculoskeletal: Negative for back pain. Skin: Negative for rash. Neurological: Negative for headaches, focal weakness or numbness.  10-point ROS otherwise negative.  ____________________________________________   PHYSICAL EXAM:  VITAL SIGNS: ED Triage Vitals  Enc Vitals Group     BP 11/30/15 1844 115/62     Pulse Rate 11/30/15 1844 71     Resp 11/30/15 1844 18     Temp 11/30/15 1844 98.3 F (36.8 C)     Temp Source 11/30/15 1844 Oral     SpO2 11/30/15 1844 99 %     Weight 11/30/15 1843 190 lb (86.2 kg)     Height 11/30/15 1843 5\' 6"  (1.676 m)     Pain Score 11/30/15 1845 10   Constitutional: Alert and oriented. Well appearing and in no acute distress. Eyes: Conjunctivae are normal Head: Atraumatic. Nose: No congestion/rhinnorhea. Mouth/Throat: Mucous membranes are moist.  Oropharynx non-erythematous. Managing oral secretions. Soft submandibular compartment. Tenderness to palpation of the left lower molar. No obvious abscess. No PTA.  Neck: No stridor. Cardiovascular: Normal rate, regular rhythm. Good peripheral circulation. Grossly normal heart sounds.  Respiratory: Normal respiratory effort.  No retractions. Lungs CTAB. Gastrointestinal: Soft and nontender. No distention.  Musculoskeletal: No lower extremity tenderness nor edema. No gross deformities of extremities. Neurologic:  Normal speech and language. No gross focal neurologic deficits are appreciated.  Skin:  Skin is warm, dry and intact. No rash  noted.  ____________________________________________   LABS (all labs ordered are listed, but only abnormal results are displayed)  Labs Reviewed  COMPREHENSIVE METABOLIC PANEL - Abnormal; Notable for the following:       Result Value   Calcium 8.6 (*)    Anion gap 4 (*)    All other components within normal limits  CBC WITH DIFFERENTIAL/PLATELET   ____________________________________________  RADIOLOGY  None ____________________________________________   PROCEDURES  Procedure(s) performed:   Procedures  None ____________________________________________   INITIAL IMPRESSION / ASSESSMENT AND PLAN / ED COURSE  Pertinent labs & imaging results that were available during my care of the patient were reviewed by me and considered in my medical decision making (see chart for details).  Patient resents to the emergency department for evaluation of vomiting and sore throat in the setting of known dental infection. The patient is seen a dentist plan to return later this month. She's been taking clindamycin and ibuprofen with significant vomiting. Afebrile here. No evidence of airway compromise. Patient has some mild tenderness on my exam. No evidence to suggest early Ludwig angina. No PTA. No clear indication for CT scan of the neck at this time. Plan for Zofran, by mouth challenge, baseline labs with significant vomiting to rule out underlying electrolyte abnormality, and reassessment.   Labs reviewed and unremarkable. Patient is feeling much better. Plan for discharge at this time. Plan to continue Clindamycin.   At this time, I do not feel there is any life-threatening condition present. I have reviewed and discussed all results (EKG, imaging, lab, urine as appropriate), exam findings with patient. I have reviewed nursing notes and appropriate previous records.  I feel the patient is safe to be discharged home without further emergent workup. Discussed usual and customary return  precautions. Patient and family (if present) verbalize understanding and are comfortable with this plan.  Patient will follow-up with their primary care provider. If they do not have a primary care provider, information for follow-up has been provided to them. All questions have been answered.  ____________________________________________  FINAL CLINICAL IMPRESSION(S) / ED DIAGNOSES  Final diagnoses:  Pain, dental     MEDICATIONS GIVEN DURING THIS VISIT:  Medications  ondansetron (ZOFRAN-ODT) disintegrating tablet 4 mg (4 mg Oral Given 11/30/15 1914)  oxyCODONE-acetaminophen (PERCOCET/ROXICET) 5-325 MG per tablet 1 tablet (1 tablet Oral Given 11/30/15 1914)     NEW OUTPATIENT MEDICATIONS STARTED DURING THIS VISIT:  Discharge Medication List as of 11/30/2015  8:14 PM    START taking these medications   Details  oxyCODONE-acetaminophen (PERCOCET/ROXICET) 5-325 MG tablet Take 1 tablet by mouth every 4 (four) hours as needed for severe pain., Starting Sat 11/30/2015, Print          Note:  This document was prepared using Dragon voice recognition software and may include unintentional dictation errors.  Nanda Quinton, MD Emergency Medicine   Margette Fast, MD 12/01/15 1024

## 2015-11-30 NOTE — ED Triage Notes (Signed)
Pt reports dental pain that started on Wed. Pt was seen by a dentist on Thursday and placed on abx and 600 mg Ibuprofen. Pt states she started running a fever today. Temp was 101 at home. Pt reports she has been taking Ibuprofen and Tylenol. Pt reports she started having emesis today.

## 2016-03-11 ENCOUNTER — Emergency Department (HOSPITAL_COMMUNITY)
Admission: EM | Admit: 2016-03-11 | Discharge: 2016-03-11 | Disposition: A | Discharge status: Home / Self Care | Payer: Managed Care, Other (non HMO) | Attending: Emergency Medicine | Admitting: Emergency Medicine

## 2016-03-11 ENCOUNTER — Emergency Department (HOSPITAL_COMMUNITY): Payer: Managed Care, Other (non HMO)

## 2016-03-11 ENCOUNTER — Encounter (HOSPITAL_COMMUNITY): Payer: Self-pay | Admitting: Emergency Medicine

## 2016-03-11 DIAGNOSIS — N189 Chronic kidney disease, unspecified: Secondary | ICD-10-CM | POA: Insufficient documentation

## 2016-03-11 DIAGNOSIS — Z79899 Other long term (current) drug therapy: Secondary | ICD-10-CM | POA: Insufficient documentation

## 2016-03-11 DIAGNOSIS — Z85828 Personal history of other malignant neoplasm of skin: Secondary | ICD-10-CM | POA: Insufficient documentation

## 2016-03-11 DIAGNOSIS — R1031 Right lower quadrant pain: Secondary | ICD-10-CM | POA: Diagnosis present

## 2016-03-11 DIAGNOSIS — A5901 Trichomonal vulvovaginitis: Secondary | ICD-10-CM | POA: Diagnosis not present

## 2016-03-11 DIAGNOSIS — F17210 Nicotine dependence, cigarettes, uncomplicated: Secondary | ICD-10-CM | POA: Diagnosis not present

## 2016-03-11 DIAGNOSIS — R102 Pelvic and perineal pain: Secondary | ICD-10-CM

## 2016-03-11 LAB — WET PREP, GENITAL
SPERM: NONE SEEN
Yeast Wet Prep HPF POC: NONE SEEN

## 2016-03-11 LAB — URINALYSIS, ROUTINE W REFLEX MICROSCOPIC
BILIRUBIN URINE: NEGATIVE
Glucose, UA: NEGATIVE mg/dL
HGB URINE DIPSTICK: NEGATIVE
KETONES UR: NEGATIVE mg/dL
Leukocytes, UA: NEGATIVE
Nitrite: NEGATIVE
PH: 5 (ref 5.0–8.0)
Protein, ur: NEGATIVE mg/dL
Specific Gravity, Urine: 1.029 (ref 1.005–1.030)

## 2016-03-11 LAB — COMPREHENSIVE METABOLIC PANEL
ALBUMIN: 4.2 g/dL (ref 3.5–5.0)
ALT: 15 U/L (ref 14–54)
AST: 15 U/L (ref 15–41)
Alkaline Phosphatase: 56 U/L (ref 38–126)
Anion gap: 6 (ref 5–15)
BILIRUBIN TOTAL: 0.3 mg/dL (ref 0.3–1.2)
BUN: 13 mg/dL (ref 6–20)
CHLORIDE: 104 mmol/L (ref 101–111)
CO2: 27 mmol/L (ref 22–32)
Calcium: 9.3 mg/dL (ref 8.9–10.3)
Creatinine, Ser: 0.69 mg/dL (ref 0.44–1.00)
GFR calc Af Amer: >60 mL/min (ref >60)
GFR calc non Af Amer: >60 mL/min (ref >60)
GLUCOSE: 89 mg/dL (ref 65–99)
POTASSIUM: 3.9 mmol/L (ref 3.5–5.1)
Sodium: 137 mmol/L (ref 135–145)
TOTAL PROTEIN: 7.3 g/dL (ref 6.5–8.1)

## 2016-03-11 LAB — CBC
HEMATOCRIT: 43.7 % (ref 36.0–46.0)
Hemoglobin: 15 g/dL (ref 12.0–15.0)
MCH: 31.6 pg (ref 26.0–34.0)
MCHC: 34.3 g/dL (ref 30.0–36.0)
MCV: 92.2 fL (ref 78.0–100.0)
Platelets: 266 10*3/uL (ref 150–400)
RBC: 4.74 MIL/uL (ref 3.87–5.11)
RDW: 12.1 % (ref 11.5–15.5)
WBC: 10 10*3/uL (ref 4.0–10.5)

## 2016-03-11 LAB — LIPASE, BLOOD: Lipase: 18 U/L (ref 11–51)

## 2016-03-11 LAB — PREGNANCY, URINE: Preg Test, Ur: NEGATIVE

## 2016-03-11 MED ORDER — SODIUM CHLORIDE 0.9 % IV BOLUS (SEPSIS)
1000.0000 mL | Freq: Once | INTRAVENOUS | Status: AC
  Administered 2016-03-11: 1000 mL via INTRAVENOUS

## 2016-03-11 MED ORDER — ONDANSETRON HCL 4 MG/2ML IJ SOLN
4.0000 mg | Freq: Once | INTRAMUSCULAR | Status: AC
  Administered 2016-03-11: 4 mg via INTRAVENOUS
  Filled 2016-03-11: qty 2

## 2016-03-11 MED ORDER — LIDOCAINE HCL (PF) 2 % IJ SOLN
INTRAMUSCULAR | Status: AC
  Administered 2016-03-11: 10 mL
  Filled 2016-03-11: qty 10

## 2016-03-11 MED ORDER — DOXYCYCLINE HYCLATE 100 MG PO CAPS: 100.0000 mg | ORAL_CAPSULE | Freq: Two times a day (BID) | ORAL | 0 refills | Status: DC

## 2016-03-11 MED ORDER — ONDANSETRON 8 MG PO TBDP: 8.0000 mg | ORAL_TABLET | Freq: Three times a day (TID) | ORAL | 0 refills | Status: DC | PRN

## 2016-03-11 MED ORDER — METRONIDAZOLE 500 MG PO TABS: 500.0000 mg | ORAL_TABLET | Freq: Two times a day (BID) | ORAL | 0 refills | Status: DC

## 2016-03-11 MED ORDER — HYDROMORPHONE HCL 1 MG/ML IJ SOLN
1.0000 mg | Freq: Once | INTRAMUSCULAR | Status: AC
  Administered 2016-03-11: 1 mg via INTRAVENOUS
  Filled 2016-03-11: qty 1

## 2016-03-11 MED ORDER — ONDANSETRON 4 MG PO TBDP
4.0000 mg | ORAL_TABLET | Freq: Once | ORAL | Status: AC
  Administered 2016-03-11: 4 mg via ORAL
  Filled 2016-03-11: qty 1

## 2016-03-11 MED ORDER — KETOROLAC TROMETHAMINE 30 MG/ML IJ SOLN
30.0000 mg | Freq: Once | INTRAMUSCULAR | Status: AC
  Administered 2016-03-11: 30 mg via INTRAVENOUS
  Filled 2016-03-11: qty 1

## 2016-03-11 MED ORDER — NAPROXEN 500 MG PO TABS: 500.0000 mg | ORAL_TABLET | Freq: Two times a day (BID) | ORAL | 0 refills | Status: DC

## 2016-03-11 MED ORDER — HYDROCODONE-ACETAMINOPHEN 5-325 MG PO TABS: 1.0000 | ORAL_TABLET | ORAL | 0 refills | Status: DC | PRN

## 2016-03-11 MED ORDER — CEFTRIAXONE SODIUM 250 MG IJ SOLR
250.0000 mg | Freq: Once | INTRAMUSCULAR | Status: AC
  Administered 2016-03-11: 250 mg via INTRAMUSCULAR
  Filled 2016-03-11: qty 250

## 2016-03-11 MED ORDER — LIDOCAINE HCL (CARDIAC) 20 MG/ML IV SOLN
INTRAVENOUS | Status: AC
  Filled 2016-03-11: qty 5

## 2016-03-11 MED ORDER — AZITHROMYCIN 250 MG PO TABS
1000.0000 mg | ORAL_TABLET | Freq: Once | ORAL | Status: AC
  Administered 2016-03-11: 1000 mg via ORAL
  Filled 2016-03-11: qty 4

## 2016-03-11 NOTE — ED Notes (Signed)
Pt in ultrasound at this time

## 2016-03-11 NOTE — ED Provider Notes (Signed)
[facility] DEPT Provider Note   CSN: [id number] Arrival date & time: 03/11/16  1047     History   Chief Complaint Chief Complaint  Patient presents with  . Abdominal Pain    HPI Crystal Zamora is a 31 y.o. female.  HPI  31 year old female presents with severe right lower quadrant abdominal pain. Started this morning around 7 AM. Pain has been constant and now is starting to go into her right flank/back. She has a history of kidney stones in the past but states typically only hurts in the back. Denies urinary symptoms including hematuria or dysuria. No vaginal bleeding or discharge. LMP 1/21. Has had nausea and vomiting this morning. One episode of loose watery stool. Tried Tylenol without relief.  Past Medical History:  Diagnosis Date  . Cancer ([place])    skin melanoma  ,  6 removed stomach and back  . Chronic kidney disease    stones   . Endometriosis   . Endometriosis   . GERD (gastroesophageal reflux disease)     Patient Active Problem List   Diagnosis Date Noted  . Acute sinusitis 03/11/2014  . Blurry vision, right eye 03/11/2014  . Shingles rash 03/11/2014    Past Surgical History:  Procedure Laterality Date  . CESAREAN SECTION    . CHOLECYSTECTOMY N/A 02/24/2013   Procedure: LAPAROSCOPIC CHOLECYSTECTOMY;  Surgeon: Jamesetta So, MD;  Location: [facility];  Service: General;  Laterality: N/A;  . CYSTOSCOPY/URETEROSCOPY/HOLMIUM LASER/STENT PLACEMENT Left 02/25/2015   Procedure: CYSTOSCOPY LEFT RETROGRADE PYELOGRAM, HOLMIUM LASER APPLICATION WITH STONE BASKETRY AND LEFT URETERAL STENT PLACEMENT ;  Surgeon: Rana Snare, MD;  Location: [facility];  Service: Urology;  Laterality: Left;  . laporscopic     r/o endometriosis    OB History    Gravida Para Term Preterm AB Living   4 1 1   2      SAB TAB Ectopic Multiple Live Births   2               Home Medications    Prior to Admission medications   Medication Sig Start Date End Date Taking? Authorizing Provider    acetaminophen (TYLENOL) 500 MG tablet Take 500 mg by mouth every 6 (six) hours as needed for mild pain.   Yes Historical Provider, MD  fluticasone (FLONASE) 50 MCG/ACT nasal spray Place 1 spray into both nostrils daily as needed for allergies or rhinitis.   Yes Historical Provider, MD  ibuprofen (ADVIL,MOTRIN) 200 MG tablet Take 400 mg by mouth every 6 (six) hours as needed for moderate pain.   Yes Historical Provider, MD  Melatonin 2.5 MG CAPS Take 1 capsule by mouth at bedtime as needed (sleep).   Yes Historical Provider, MD    Family History Family History  Problem Relation Age of Onset  . Heart failure Father     Social History Social History  Substance Use Topics  . Smoking status: Current Every Day Smoker    Packs/day: 0.50    Types: Cigarettes  . Smokeless tobacco: Never Used  . Alcohol use No     Allergies   Patient has no known allergies.   Review of Systems Review of Systems  Respiratory: Negative for shortness of breath.   Gastrointestinal: Positive for abdominal pain, diarrhea (1 time), nausea and vomiting.  Genitourinary: Negative for dysuria, hematuria, vaginal bleeding and vaginal discharge.  Musculoskeletal: Positive for back pain.  All other systems reviewed and are negative.    Physical Exam Updated Vital Signs  BP 125/65   Pulse 77   Temp 98.3 F (36.8 C)   Resp 18   Ht 5\' 6"  (1.676 m)   Wt 200 lb (90.7 kg)   LMP 02/16/2016   SpO2 100%   BMI 32.28 kg/m   Physical Exam  Constitutional: She is oriented to person, place, and time. She appears well-developed and well-nourished. She appears distressed (in pain).  Obese  HENT:  Head: Normocephalic and atraumatic.  Right Ear: External ear normal.  Left Ear: External ear normal.  Nose: Nose normal.  Eyes: Right eye exhibits no discharge. Left eye exhibits no discharge.  Cardiovascular: Normal rate, regular rhythm and normal heart sounds.   Pulmonary/Chest: Effort normal and breath sounds  normal.  Abdominal: Soft. There is tenderness (worst in RLQ) in the right upper quadrant, right lower quadrant and suprapubic area. There is no CVA tenderness.  Genitourinary: Right adnexum displays no mass. Left adnexum displays no mass. No bleeding in the vagina. Vaginal discharge (mild) found.  Neurological: She is alert and oriented to person, place, and time.  Skin: Skin is warm and dry. She is not diaphoretic.  Nursing note and vitals reviewed.    ED Treatments / Results  Labs (all labs ordered are listed, but only abnormal results are displayed) Labs Reviewed  URINALYSIS, ROUTINE W REFLEX MICROSCOPIC - Abnormal; Notable for the following:       Result Value   APPearance HAZY (*)    All other components within normal limits  WET PREP, GENITAL  LIPASE, BLOOD  COMPREHENSIVE METABOLIC PANEL  CBC  PREGNANCY, URINE  GC/CHLAMYDIA PROBE AMP ([place]) NOT AT [facility]    EKG  EKG Interpretation None       Radiology No results found.  Procedures Procedures (including critical care time)  Medications Ordered in ED Medications  ondansetron (ZOFRAN-ODT) disintegrating tablet 4 mg (4 mg Oral Given 03/11/16 1339)  HYDROmorphone (DILAUDID) injection 1 mg (1 mg Intravenous Given 03/11/16 1451)  sodium chloride 0.9 % bolus 1,000 mL (1,000 mLs Intravenous New Bag/Given 03/11/16 1451)  ondansetron (ZOFRAN) injection 4 mg (4 mg Intravenous Given 03/11/16 1451)     Initial Impression / Assessment and Plan / ED Course  I have reviewed the triage vital signs and the nursing notes.  Pertinent labs & imaging results that were available during my care of the patient were reviewed by me and considered in my medical decision making (see chart for details).     Will get CT to r/o stone but if no stone or appy will need pelvic u/s to r/o torsion. In pain but stable. Pain much better, no further vomiting. Care to Dr. Tomi Bamberger.  Final Clinical Impressions(s) / ED Diagnoses   Final diagnoses:   None    New Prescriptions New Prescriptions   No medications on file     Sherwood Gambler, MD 03/11/16 1542

## 2016-03-11 NOTE — ED Provider Notes (Signed)
Discussed ct scan findings with patient.  Plan is for [place] to rule out torsion.  Wet prep shows trich.  Will plan on abx treatment for possible STD  Ultrasound although limited ultimately did not show any evidence of ovarian torsion or pelvic abnormality. Patient's symptoms are more left-sided time not concerned about possibility of a right-sided ovarian torsion.  Did have Trichomonas in her urine. Plan on discharge home with prescription for doxycycline to cover for PID and Flagyl. Patient also mentions have any history of endometriosis. It's possible this can be associated with some of the symptoms she is having today. We discussed the importance of follow-up with an OB/GYN doctor   Dorie Rank, MD 03/11/16 2006

## 2016-03-11 NOTE — ED Triage Notes (Signed)
Pt c/o rlq pain radiating to back with n/v/d since 0600 this am.

## 2016-03-11 NOTE — Discharge Instructions (Signed)
Follow-up with your OB/GYN doctor, take the medications as prescribed

## 2016-03-12 LAB — GC/CHLAMYDIA PROBE AMP (~~LOC~~) NOT AT ARMC
Chlamydia: NEGATIVE
NEISSERIA GONORRHEA: NEGATIVE

## 2016-03-12 LAB — RPR: RPR Ser Ql: NONREACTIVE

## 2016-03-12 LAB — HIV ANTIBODY (ROUTINE TESTING W REFLEX): HIV SCREEN 4TH GENERATION: NONREACTIVE

## 2017-07-12 ENCOUNTER — Emergency Department (HOSPITAL_COMMUNITY): Payer: Managed Care, Other (non HMO)

## 2017-07-12 ENCOUNTER — Other Ambulatory Visit: Payer: Self-pay

## 2017-07-12 ENCOUNTER — Encounter (HOSPITAL_COMMUNITY): Payer: Self-pay | Admitting: Emergency Medicine

## 2017-07-12 ENCOUNTER — Emergency Department (HOSPITAL_COMMUNITY)
Admission: EM | Admit: 2017-07-12 | Discharge: 2017-07-12 | Disposition: A | Discharge status: Home / Self Care | Payer: Managed Care, Other (non HMO) | Attending: Emergency Medicine | Admitting: Emergency Medicine

## 2017-07-12 DIAGNOSIS — B9689 Other specified bacterial agents as the cause of diseases classified elsewhere: Secondary | ICD-10-CM

## 2017-07-12 DIAGNOSIS — N760 Acute vaginitis: Secondary | ICD-10-CM | POA: Diagnosis not present

## 2017-07-12 DIAGNOSIS — Z79899 Other long term (current) drug therapy: Secondary | ICD-10-CM | POA: Diagnosis not present

## 2017-07-12 DIAGNOSIS — Z85828 Personal history of other malignant neoplasm of skin: Secondary | ICD-10-CM | POA: Insufficient documentation

## 2017-07-12 DIAGNOSIS — R102 Pelvic and perineal pain: Secondary | ICD-10-CM | POA: Diagnosis present

## 2017-07-12 DIAGNOSIS — R1030 Lower abdominal pain, unspecified: Secondary | ICD-10-CM

## 2017-07-12 DIAGNOSIS — F17210 Nicotine dependence, cigarettes, uncomplicated: Secondary | ICD-10-CM | POA: Insufficient documentation

## 2017-07-12 LAB — URINALYSIS, ROUTINE W REFLEX MICROSCOPIC
BACTERIA UA: NONE SEEN
Bilirubin Urine: NEGATIVE
GLUCOSE, UA: NEGATIVE mg/dL
Ketones, ur: NEGATIVE mg/dL
Leukocytes, UA: NEGATIVE
Nitrite: NEGATIVE
PROTEIN: NEGATIVE mg/dL
Specific Gravity, Urine: 1.02 (ref 1.005–1.030)
pH: 5 (ref 5.0–8.0)

## 2017-07-12 LAB — BASIC METABOLIC PANEL
Anion gap: 7 (ref 5–15)
BUN: 10 mg/dL (ref 6–20)
CHLORIDE: 108 mmol/L (ref 101–111)
CO2: 24 mmol/L (ref 22–32)
Calcium: 8.9 mg/dL (ref 8.9–10.3)
Creatinine, Ser: 0.74 mg/dL (ref 0.44–1.00)
GFR calc Af Amer: >60 mL/min (ref >60)
GFR calc non Af Amer: >60 mL/min (ref >60)
GLUCOSE: 99 mg/dL (ref 65–99)
POTASSIUM: 4.2 mmol/L (ref 3.5–5.1)
Sodium: 139 mmol/L (ref 135–145)

## 2017-07-12 LAB — WET PREP, GENITAL
Trich, Wet Prep: NONE SEEN
YEAST WET PREP: NONE SEEN

## 2017-07-12 LAB — CBC WITH DIFFERENTIAL/PLATELET
Basophils Absolute: 0.1 10*3/uL (ref 0.0–0.1)
Basophils Relative: 1 %
Eosinophils Absolute: 0.3 10*3/uL (ref 0.0–0.7)
Eosinophils Relative: 3 %
HCT: 44.7 % (ref 36.0–46.0)
HEMOGLOBIN: 14.4 g/dL (ref 12.0–15.0)
LYMPHS ABS: 2.1 10*3/uL (ref 0.7–4.0)
LYMPHS PCT: 26 %
MCH: 29.9 pg (ref 26.0–34.0)
MCHC: 32.2 g/dL (ref 30.0–36.0)
MCV: 92.9 fL (ref 78.0–100.0)
MONOS PCT: 5 %
Monocytes Absolute: 0.4 10*3/uL (ref 0.1–1.0)
NEUTROS PCT: 65 %
Neutro Abs: 5.3 10*3/uL (ref 1.7–7.7)
Platelets: 288 10*3/uL (ref 150–400)
RBC: 4.81 MIL/uL (ref 3.87–5.11)
RDW: 12.7 % (ref 11.5–15.5)
WBC: 8.2 10*3/uL (ref 4.0–10.5)

## 2017-07-12 LAB — HCG, QUANTITATIVE, PREGNANCY: HCG, BETA CHAIN, QUANT, S: 27 m[IU]/mL — AB (ref <5)

## 2017-07-12 LAB — POC URINE PREG, ED: Preg Test, Ur: NEGATIVE

## 2017-07-12 MED ORDER — KETOROLAC TROMETHAMINE 30 MG/ML IJ SOLN
30.0000 mg | Freq: Once | INTRAMUSCULAR | Status: AC
  Administered 2017-07-12: 30 mg via INTRAVENOUS
  Filled 2017-07-12: qty 1

## 2017-07-12 MED ORDER — LACTATED RINGERS IV BOLUS
1000.0000 mL | Freq: Once | INTRAVENOUS | Status: AC
  Administered 2017-07-12: 1000 mL via INTRAVENOUS

## 2017-07-12 MED ORDER — ONDANSETRON HCL 4 MG/2ML IJ SOLN
4.0000 mg | Freq: Once | INTRAMUSCULAR | Status: AC
Start: 2017-07-12 — End: 2017-07-12
  Administered 2017-07-12: 4 mg via INTRAVENOUS
  Filled 2017-07-12: qty 2

## 2017-07-12 MED ORDER — FENTANYL CITRATE (PF) 100 MCG/2ML IJ SOLN
50.0000 ug | Freq: Once | INTRAMUSCULAR | Status: AC
  Administered 2017-07-12: 50 ug via INTRAVENOUS
  Filled 2017-07-12: qty 2

## 2017-07-12 MED ORDER — METRONIDAZOLE 500 MG PO TABS: 500.0000 mg | ORAL_TABLET | Freq: Two times a day (BID) | ORAL | 0 refills | Status: DC

## 2017-07-12 MED ORDER — OXYCODONE-ACETAMINOPHEN 5-325 MG PO TABS
2.0000 | ORAL_TABLET | Freq: Once | ORAL | Status: DC
  Filled 2017-07-12: qty 2

## 2017-07-12 MED ORDER — KETOROLAC TROMETHAMINE 10 MG PO TABS: 10.0000 mg | ORAL_TABLET | Freq: Four times a day (QID) | ORAL | 0 refills | Status: DC | PRN

## 2017-07-12 MED ORDER — METRONIDAZOLE 500 MG PO TABS
500.0000 mg | ORAL_TABLET | Freq: Once | ORAL | Status: AC
  Administered 2017-07-12: 500 mg via ORAL
  Filled 2017-07-12: qty 1

## 2017-07-12 MED ORDER — HYDROMORPHONE HCL 1 MG/ML IJ SOLN
1.0000 mg | Freq: Once | INTRAMUSCULAR | Status: AC
  Administered 2017-07-12: 1 mg via INTRAVENOUS
  Filled 2017-07-12: qty 1

## 2017-07-12 MED ORDER — METRONIDAZOLE 500 MG PO TABS: 500.0000 mg | ORAL_TABLET | Freq: Once | ORAL | Status: DC

## 2017-07-12 NOTE — ED Notes (Addendum)
Pt waiting on shot time prior to discharge. Pt walked out prior to RN being able to obtain vitals. Discharge instructions were reviewed with pt by Lana Fish, RN and discharge paperwork signed prior to shot time.

## 2017-07-12 NOTE — ED Provider Notes (Signed)
Pt signed out by Dr. Dolly Rias awaiting pelvic US.  [place] was ok.  Pt is still having some pain, so she was given toradol.  She was treated for BV with flagyl.  Labs are ok and she is nonfebrile, so I don't think she needs a CT abd/pelvis.  She also has a hx of endometriosis so this may be the cause.  She knows to return if worse and to f/u with gyn and with pcp.   Isla Pence, MD 07/12/17 [phone]

## 2017-07-12 NOTE — ED Provider Notes (Addendum)
[facility] EMERGENCY DEPARTMENT Provider Note   CSN: [id number] Arrival date & time: 07/12/17  0535     History   Chief Complaint Chief Complaint  Patient presents with  . Abdominal Pain    HPI Crystal Zamora is a 32 y.o. female.  Positive pregnancy test a couple weeks ago. Some vaginal bleeding afterwards, h/o miscarriages. Improved. Tonight with acute onset of left lower pelvic pain and bleeding again. Radiates to leg. Also with paresthesias distally in left leg but not proximally.   The history is provided by the patient.  Abdominal Pain   This is a new problem. The current episode started 6 to 12 hours ago. The problem occurs constantly. The problem has not changed since onset.The pain is located in the LLQ. The quality of the pain is aching and sharp. The pain is mild. Nothing aggravates the symptoms. Nothing relieves the symptoms.    Past Medical History:  Diagnosis Date  . Cancer ([place])    skin melanoma  ,  6 removed stomach and back  . Chronic kidney disease    stones   . Endometriosis   . Endometriosis   . GERD (gastroesophageal reflux disease)     Patient Active Problem List   Diagnosis Date Noted  . Acute sinusitis 03/11/2014  . Blurry vision, right eye 03/11/2014  . Shingles rash 03/11/2014    Past Surgical History:  Procedure Laterality Date  . CESAREAN SECTION    . CHOLECYSTECTOMY N/A 02/24/2013   Procedure: LAPAROSCOPIC CHOLECYSTECTOMY;  Surgeon: Jamesetta So, MD;  Location: [facility];  Service: General;  Laterality: N/A;  . CYSTOSCOPY/URETEROSCOPY/HOLMIUM LASER/STENT PLACEMENT Left 02/25/2015   Procedure: CYSTOSCOPY LEFT RETROGRADE PYELOGRAM, HOLMIUM LASER APPLICATION WITH STONE BASKETRY AND LEFT URETERAL STENT PLACEMENT ;  Surgeon: Rana Snare, MD;  Location: [facility];  Service: Urology;  Laterality: Left;  . laporscopic     r/o endometriosis     OB History    Gravida  4   Para  1   Term  1   Preterm      AB  2   Living        SAB  2    TAB      Ectopic      Multiple      Live Births               Home Medications    Prior to Admission medications   Medication Sig Start Date End Date Taking? Authorizing Provider  acetaminophen (TYLENOL) 500 MG tablet Take 500 mg by mouth every 6 (six) hours as needed for mild pain.    [provider]  doxycycline (VIBRAMYCIN) 100 MG capsule Take 1 capsule (100 mg total) by mouth 2 (two) times daily. 03/11/16   Dorie Rank, MD  fluticasone (FLONASE) 50 MCG/ACT nasal spray Place 1 spray into both nostrils daily as needed for allergies or rhinitis.    [provider]  HYDROcodone-acetaminophen (NORCO/VICODIN) 5-325 MG tablet Take 1 tablet by mouth every 4 (four) hours as needed. 03/11/16   Dorie Rank, MD  ibuprofen (ADVIL,MOTRIN) 200 MG tablet Take 400 mg by mouth every 6 (six) hours as needed for moderate pain.    [provider]  Melatonin 2.5 MG CAPS Take 1 capsule by mouth at bedtime as needed (sleep).    [provider]  metroNIDAZOLE (FLAGYL) 500 MG tablet Take 1 tablet (500 mg total) by mouth 2 (two) times daily. 03/11/16   Dorie Rank, MD  naproxen (NAPROSYN) 500 MG tablet Take 1 tablet (500 mg total) by mouth 2 (two) times daily. 03/11/16   Dorie Rank, MD  ondansetron (ZOFRAN ODT) 8 MG disintegrating tablet Take 1 tablet (8 mg total) by mouth every 8 (eight) hours as needed for nausea or vomiting. 03/11/16   Dorie Rank, MD    Family History Family History  Problem Relation Age of Onset  . Heart failure Father     Social History Social History   Tobacco Use  . Smoking status: Current Every Day Smoker    Packs/day: 0.50    Types: Cigarettes  . Smokeless tobacco: Never Used  Substance Use Topics  . Alcohol use: No  . Drug use: No     Allergies   Patient has no known allergies.   Review of Systems Review of Systems  Gastrointestinal: Positive for abdominal pain.  All other systems reviewed and are negative.    Physical  Exam Updated Vital Signs BP (!) 146/101 (BP Location: Right Arm)   Pulse 94   Temp 97.9 F (36.6 C) (Oral)   Resp 16   Ht 5\' 6"  (1.676 m)   Wt 90.7 kg (200 lb)   LMP 06/28/2017   SpO2 98%   BMI 32.28 kg/m   Physical Exam  Constitutional: She appears well-developed and well-nourished.  HENT:  Head: Normocephalic and atraumatic.  Eyes: Conjunctivae and EOM are normal.  Neck: Normal range of motion.  Cardiovascular: Normal rate and regular rhythm.  Pulmonary/Chest: Effort normal. No stridor. No respiratory distress.  Abdominal: Soft. Normal appearance and bowel sounds are normal. She exhibits no distension. There is no rigidity, no guarding and no CVA tenderness.  Genitourinary: Cervix exhibits motion tenderness. Right adnexum displays no mass, no tenderness and no fullness. Left adnexum displays tenderness. Left adnexum displays no mass. There is bleeding in the vagina.  Neurological: She is alert.  Skin: Skin is warm and dry.  Nursing note and vitals reviewed.    ED Treatments / Results  Labs (all labs ordered are listed, but only abnormal results are displayed) Labs Reviewed  WET PREP, GENITAL  CBC WITH DIFFERENTIAL/PLATELET  BASIC METABOLIC PANEL  URINALYSIS, ROUTINE W REFLEX MICROSCOPIC  HCG, QUANTITATIVE, PREGNANCY  POC URINE PREG, ED  GC/CHLAMYDIA PROBE AMP ([place]) NOT AT [facility]    EKG None  Radiology No results found.  Procedures Procedures (including critical care time)  Medications Ordered in ED Medications  oxyCODONE-acetaminophen (PERCOCET/ROXICET) 5-325 MG per tablet 2 tablet (has no administration in time range)     Initial Impression / Assessment and Plan / ED Course  I have reviewed the triage vital signs and the nursing notes.  Pertinent labs & imaging results that were available during my care of the patient were reviewed by me and considered in my medical decision making (see chart for details).    Cyst vs endometriosis vs torsion?  Will [place].   Care transferred pending [place]. Plan to dc on flagyl.  Final Clinical Impressions(s) / ED Diagnoses   Final diagnoses:  None    ED Discharge Orders    None         Donyae Kohn, Corene Cornea, MD 07/12/17 [phone]

## 2017-07-12 NOTE — ED Triage Notes (Signed)
Pt c/o left lower abd pain that radiates to the leg. Pt states she had positive pregnancy test and a negative one. Pt states she has been bleeding x 2 weeks.

## 2017-07-13 LAB — GC/CHLAMYDIA PROBE AMP (~~LOC~~) NOT AT ARMC
Chlamydia: NEGATIVE
Neisseria Gonorrhea: NEGATIVE

## 2018-02-04 ENCOUNTER — Emergency Department (HOSPITAL_COMMUNITY)
Admission: EM | Admit: 2018-02-04 | Discharge: 2018-02-04 | Disposition: A | Discharge status: Home / Self Care | Payer: 59 | Attending: Emergency Medicine | Admitting: Emergency Medicine

## 2018-02-04 ENCOUNTER — Encounter (HOSPITAL_COMMUNITY): Payer: Self-pay | Admitting: Emergency Medicine

## 2018-02-04 ENCOUNTER — Other Ambulatory Visit: Payer: Self-pay

## 2018-02-04 ENCOUNTER — Emergency Department (HOSPITAL_COMMUNITY): Payer: 59

## 2018-02-04 DIAGNOSIS — Z85820 Personal history of malignant melanoma of skin: Secondary | ICD-10-CM | POA: Diagnosis not present

## 2018-02-04 DIAGNOSIS — M5432 Sciatica, left side: Secondary | ICD-10-CM | POA: Diagnosis not present

## 2018-02-04 DIAGNOSIS — N189 Chronic kidney disease, unspecified: Secondary | ICD-10-CM | POA: Diagnosis not present

## 2018-02-04 DIAGNOSIS — Z79899 Other long term (current) drug therapy: Secondary | ICD-10-CM | POA: Diagnosis not present

## 2018-02-04 DIAGNOSIS — F17210 Nicotine dependence, cigarettes, uncomplicated: Secondary | ICD-10-CM | POA: Insufficient documentation

## 2018-02-04 DIAGNOSIS — R1032 Left lower quadrant pain: Secondary | ICD-10-CM | POA: Diagnosis present

## 2018-02-04 LAB — URINALYSIS, ROUTINE W REFLEX MICROSCOPIC
Bilirubin Urine: NEGATIVE
Glucose, UA: NEGATIVE mg/dL
Hgb urine dipstick: NEGATIVE
Ketones, ur: NEGATIVE mg/dL
Leukocytes, UA: NEGATIVE
Nitrite: NEGATIVE
Protein, ur: NEGATIVE mg/dL
Specific Gravity, Urine: 1.018 (ref 1.005–1.030)
pH: 6 (ref 5.0–8.0)

## 2018-02-04 LAB — I-STAT BETA HCG BLOOD, ED (MC, WL, AP ONLY)

## 2018-02-04 LAB — CBC WITH DIFFERENTIAL/PLATELET
ABS IMMATURE GRANULOCYTES: 0.04 10*3/uL (ref 0.00–0.07)
BASOS PCT: 1 %
Basophils Absolute: 0.1 10*3/uL (ref 0.0–0.1)
Eosinophils Absolute: 0.3 10*3/uL (ref 0.0–0.5)
Eosinophils Relative: 3 %
HCT: 47 % — ABNORMAL HIGH (ref 36.0–46.0)
Hemoglobin: 15.1 g/dL — ABNORMAL HIGH (ref 12.0–15.0)
Immature Granulocytes: 0 %
Lymphocytes Relative: 24 %
Lymphs Abs: 2.3 10*3/uL (ref 0.7–4.0)
MCH: 30.2 pg (ref 26.0–34.0)
MCHC: 32.1 g/dL (ref 30.0–36.0)
MCV: 94 fL (ref 80.0–100.0)
Monocytes Absolute: 0.7 10*3/uL (ref 0.1–1.0)
Monocytes Relative: 7 %
NEUTROS ABS: 6.5 10*3/uL (ref 1.7–7.7)
Neutrophils Relative %: 65 %
Platelets: 307 10*3/uL (ref 150–400)
RBC: 5 MIL/uL (ref 3.87–5.11)
RDW: 12.3 % (ref 11.5–15.5)
WBC: 9.8 10*3/uL (ref 4.0–10.5)
nRBC: 0 % (ref 0.0–0.2)

## 2018-02-04 LAB — COMPREHENSIVE METABOLIC PANEL
ALT: 18 U/L (ref 0–44)
AST: 15 U/L (ref 15–41)
Albumin: 4.1 g/dL (ref 3.5–5.0)
Alkaline Phosphatase: 57 U/L (ref 38–126)
Anion gap: 6 (ref 5–15)
BUN: 14 mg/dL (ref 6–20)
CO2: 24 mmol/L (ref 22–32)
Calcium: 9 mg/dL (ref 8.9–10.3)
Chloride: 108 mmol/L (ref 98–111)
Creatinine, Ser: 0.65 mg/dL (ref 0.44–1.00)
GFR calc Af Amer: >60 mL/min (ref >60)
GFR calc non Af Amer: >60 mL/min (ref >60)
Glucose, Bld: 88 mg/dL (ref 70–99)
POTASSIUM: 3.9 mmol/L (ref 3.5–5.1)
Sodium: 138 mmol/L (ref 135–145)
Total Bilirubin: 0.4 mg/dL (ref 0.3–1.2)
Total Protein: 7.3 g/dL (ref 6.5–8.1)

## 2018-02-04 MED ORDER — NAPROXEN 500 MG PO TABS: 500.0000 mg | ORAL_TABLET | Freq: Two times a day (BID) | ORAL | 0 refills | Status: DC

## 2018-02-04 MED ORDER — METHOCARBAMOL 1000 MG/10ML IJ SOLN
INTRAMUSCULAR | Status: AC
  Filled 2018-02-04: qty 10

## 2018-02-04 MED ORDER — KETOROLAC TROMETHAMINE 30 MG/ML IJ SOLN
30.0000 mg | Freq: Once | INTRAMUSCULAR | Status: AC
  Administered 2018-02-04: 30 mg via INTRAVENOUS
  Filled 2018-02-04: qty 1

## 2018-02-04 MED ORDER — METHOCARBAMOL 1000 MG/10ML IJ SOLN
1000.0000 mg | Freq: Once | INTRAVENOUS | Status: AC
  Administered 2018-02-04: 1000 mg via INTRAVENOUS
  Filled 2018-02-04: qty 10

## 2018-02-04 MED ORDER — ONDANSETRON HCL 4 MG/2ML IJ SOLN
4.0000 mg | Freq: Once | INTRAMUSCULAR | Status: AC
  Administered 2018-02-04: 4 mg via INTRAVENOUS
  Filled 2018-02-04: qty 2

## 2018-02-04 MED ORDER — SODIUM CHLORIDE 0.9 % IV BOLUS
1000.0000 mL | Freq: Once | INTRAVENOUS | Status: AC
  Administered 2018-02-04: 1000 mL via INTRAVENOUS

## 2018-02-04 MED ORDER — MORPHINE SULFATE (PF) 4 MG/ML IV SOLN
4.0000 mg | Freq: Once | INTRAVENOUS | Status: AC
  Administered 2018-02-04: 4 mg via INTRAVENOUS
  Filled 2018-02-04: qty 1

## 2018-02-04 MED ORDER — ORPHENADRINE CITRATE ER 100 MG PO TB12: 100.0000 mg | ORAL_TABLET | Freq: Two times a day (BID) | ORAL | 0 refills | Status: DC

## 2018-02-04 MED ORDER — HYDROCODONE-ACETAMINOPHEN 5-325 MG PO TABS: 1.0000 | ORAL_TABLET | ORAL | 0 refills | Status: DC | PRN

## 2018-02-04 NOTE — ED Triage Notes (Signed)
Pt C/O left sided flank pain that began 1 week ago. Pt states pain got worse tonight around midnight with nausea.

## 2018-02-04 NOTE — Discharge Instructions (Addendum)
Apply ice as needed

## 2018-02-04 NOTE — ED Provider Notes (Signed)
[facility] EMERGENCY DEPARTMENT Provider Note   CSN: [id number] Arrival date & time: 02/04/18  0401     History   Chief Complaint Chief Complaint  Patient presents with  . Flank Pain    HPI Crystal Zamora is a 33 y.o. female.  The history is provided by the patient.  She has history of kidney stones, GERD, endometriosis, melanoma and comes in complaining of left flank pain for the last week.  Pain does radiate to the left buttock and thigh.  Pain got much worse about 4 hours ago.  She rates pain at 7/10.  There has been associated nausea and vomiting.  She has tried applying a heating pad and taking acetaminophen and ibuprofen without relief.  She denies fever, chills, sweats.  She denies any difficulty urinating.  She is not sure if this pain is like what she had with her kidney stone or not.  Past Medical History:  Diagnosis Date  . Cancer ([place])    skin melanoma  ,  6 removed stomach and back  . Chronic kidney disease    stones   . Endometriosis   . Endometriosis   . GERD (gastroesophageal reflux disease)     Patient Active Problem List   Diagnosis Date Noted  . Acute sinusitis 03/11/2014  . Blurry vision, right eye 03/11/2014  . Shingles rash 03/11/2014    Past Surgical History:  Procedure Laterality Date  . CESAREAN SECTION    . CHOLECYSTECTOMY N/A 02/24/2013   Procedure: LAPAROSCOPIC CHOLECYSTECTOMY;  Surgeon: Jamesetta So, MD;  Location: [facility];  Service: General;  Laterality: N/A;  . CYSTOSCOPY/URETEROSCOPY/HOLMIUM LASER/STENT PLACEMENT Left 02/25/2015   Procedure: CYSTOSCOPY LEFT RETROGRADE PYELOGRAM, HOLMIUM LASER APPLICATION WITH STONE BASKETRY AND LEFT URETERAL STENT PLACEMENT ;  Surgeon: Rana Snare, MD;  Location: [facility];  Service: Urology;  Laterality: Left;  . laporscopic     r/o endometriosis     OB History    Gravida  4   Para  1   Term  1   Preterm      AB  2   Living        SAB  2   TAB      Ectopic      Multiple      Live  Births               Home Medications    Prior to Admission medications   Medication Sig Start Date End Date Taking? Authorizing Provider  acetaminophen (TYLENOL) 500 MG tablet Take 500 mg by mouth every 6 (six) hours as needed for mild pain.    [provider]  fluticasone (FLONASE) 50 MCG/ACT nasal spray Place 1 spray into both nostrils daily as needed for allergies or rhinitis.    [provider]  ibuprofen (ADVIL,MOTRIN) 200 MG tablet Take 400 mg by mouth every 6 (six) hours as needed for moderate pain.    [provider]  ketorolac (TORADOL) 10 MG tablet Take 1 tablet (10 mg total) by mouth every 6 (six) hours as needed. 07/12/17   Isla Pence, MD  metroNIDAZOLE (FLAGYL) 500 MG tablet Take 1 tablet (500 mg total) by mouth 2 (two) times daily. 07/12/17   Isla Pence, MD  Multiple Vitamins-Calcium (ONE-A-DAY WOMENS FORMULA PO) Take 1 tablet by mouth daily.    [provider]    Family History Family History  Problem Relation Age of Onset  . Heart failure Father     Social  History Social History   Tobacco Use  . Smoking status: Current Every Day Smoker    Packs/day: 0.50    Types: Cigarettes  . Smokeless tobacco: Never Used  Substance Use Topics  . Alcohol use: No  . Drug use: No     Allergies   Patient has no known allergies.   Review of Systems Review of Systems  All other systems reviewed and are negative.    Physical Exam Updated Vital Signs BP 121/80 (BP Location: Right Arm)   Pulse 94   Temp 98.3 F (36.8 C) (Oral)   Resp 16   Ht 5\' 7"  (1.702 m)   Wt 90.7 kg   LMP 01/17/2018   SpO2 98%   BMI 31.32 kg/m   Physical Exam Vitals signs and nursing note reviewed.    33 year old female, appears uncomfortable, but is in no acute distress. Vital signs are normal. Oxygen saturation is 98%, which is normal. Head is normocephalic and atraumatic. PERRLA, EOMI. Oropharynx is clear. Neck is nontender and supple  without adenopathy or JVD. Back is nontender in the midline.  There is mild left CVA tenderness.  Straight leg raise is negative. Lungs are clear without rales, wheezes, or rhonchi. Chest is nontender. Heart has regular rate and rhythm without murmur. Abdomen is soft, flat, with moderate left lower quadrant tenderness.  There is no rebound or guarding.  There are no masses or hepatosplenomegaly and peristalsis is hypoactive. Extremities have no cyanosis or edema, full range of motion is present. Skin is warm and dry without rash. Neurologic: Mental status is normal, cranial nerves are intact, there are no motor or sensory deficits.  ED Treatments / Results  Labs (all labs ordered are listed, but only abnormal results are displayed) Labs Reviewed  CBC WITH DIFFERENTIAL/PLATELET - Abnormal; Notable for the following components:      Result Value   Hemoglobin 15.1 (*)    HCT 47.0 (*)    All other components within normal limits  URINALYSIS, ROUTINE W REFLEX MICROSCOPIC - Abnormal; Notable for the following components:   APPearance CLOUDY (*)    All other components within normal limits  COMPREHENSIVE METABOLIC PANEL  I-STAT BETA HCG BLOOD, ED ([facility], [facility], [facility] ONLY)   Radiology Ct Renal Stone Study  Result Date: 02/04/2018 CLINICAL DATA:  33 y/o F; left-sided flank pain that began 1 week ago. EXAM: CT ABDOMEN AND PELVIS WITHOUT CONTRAST TECHNIQUE: Multidetector CT imaging of the abdomen and pelvis was performed following the standard protocol without IV contrast. COMPARISON:  03/11/2016 CT abdomen and pelvis FINDINGS: Lower chest: No acute abnormality. Hepatobiliary: No focal liver abnormality is seen. Status post cholecystectomy. No biliary dilatation. Pancreas: Unremarkable. No pancreatic ductal dilatation or surrounding inflammatory changes. Spleen: Normal in size without focal abnormality. Small stable splenule noted. Adrenals/Urinary Tract: Adrenal glands are unremarkable. Kidneys are normal,  without renal calculi, focal lesion, or hydronephrosis. Bladder is unremarkable. Stomach/Bowel: Stomach is within normal limits. Appendix appears normal. No evidence of bowel wall thickening, distention, or inflammatory changes. Vascular/Lymphatic: No significant vascular findings are present. No enlarged abdominal or pelvic lymph nodes. Reproductive: Uterus and bilateral adnexa are unremarkable. Other: No abdominal wall hernia or abnormality. No abdominopelvic ascites. Musculoskeletal: No acute or significant osseous findings. IMPRESSION: No acute process identified. No urinary stone disease or hydronephrosis. Stable unremarkable CT of abdomen and pelvis. Electronically Signed   By: Kristine Garbe M.D.   On: 02/04/2018 05:35    Procedures Procedures   Medications Ordered in  ED Medications  morphine 4 MG/ML injection 4 mg (4 mg Intravenous Given 02/04/18 0432)  ondansetron (ZOFRAN) injection 4 mg (4 mg Intravenous Given 02/04/18 0432)  sodium chloride 0.9 % bolus 1,000 mL (1,000 mLs Intravenous New Bag/Given 02/04/18 0434)  ketorolac (TORADOL) 30 MG/ML injection 30 mg (30 mg Intravenous Given 02/04/18 0550)  morphine 4 MG/ML injection 4 mg (4 mg Intravenous Given 02/04/18 0551)  methocarbamol (ROBAXIN) 1,000 mg in dextrose 5 % 50 mL IVPB (1,000 mg Intravenous New Bag/Given 02/04/18 0614)     Initial Impression / Assessment and Plan / ED Course  I have reviewed the triage vital signs and the nursing notes.  Pertinent labs & imaging results that were available during my care of the patient were reviewed by me and considered in my medical decision making (see chart for details).  Left flank pain worrisome for urolithiasis.  Consider musculoskeletal pain, urinary tract infection.  Old records reviewed confirming prior episode of urolithiasis requiring surgical management.  CT of abdomen and pelvis 1 year ago showed no evidence of renal calculi.  She is given IV fluids, morphine, ondansetron and  will be sent for renal stone protocol CT scan.`  Urinalysis shows no hematuria, and CT scan shows no urolithiasis or hydronephrosis.  Degenerative changes are noted in the lumbar spine.  She did not get significant relief from above-noted treatment.  At this point, pain seems to be more musculoskeletal and she is given additional morphine as well as ketorolac and methocarbamol.  She feels much better after above-noted treatment.  She will be discharged with prescriptions for orphenadrine, naproxen, and hydrocodone-acetaminophen.  Her record on the [place] controlled substance reporting website was reviewed, and she has no recent narcotic prescriptions.  Final Clinical Impressions(s) / ED Diagnoses   Final diagnoses:  Left sided sciatica    ED Discharge Orders         Ordered    naproxen (NAPROSYN) 500 MG tablet  2 times daily     02/04/18 0712    orphenadrine (NORFLEX) 100 MG tablet  2 times daily     02/04/18 0712    HYDROcodone-acetaminophen (NORCO) 5-325 MG tablet  Every 4 hours PRN     02/04/18 8676           Delora Fuel, MD 19/50/93 [phone]

## 2018-11-29 ENCOUNTER — Other Ambulatory Visit: Payer: Self-pay | Admitting: *Deleted

## 2018-11-29 DIAGNOSIS — Z20822 Contact with and (suspected) exposure to covid-19: Secondary | ICD-10-CM

## 2018-11-30 LAB — NOVEL CORONAVIRUS, NAA: SARS-CoV-2, NAA: NOT DETECTED

## 2018-12-01 ENCOUNTER — Telehealth: Payer: Self-pay | Admitting: Internal Medicine

## 2018-12-01 NOTE — Telephone Encounter (Signed)
Negative COVID results given. Patient results "NOT Detected." Caller expressed understanding. ° °

## 2019-04-06 ENCOUNTER — Emergency Department (HOSPITAL_COMMUNITY): Payer: Self-pay

## 2019-04-06 ENCOUNTER — Encounter (HOSPITAL_COMMUNITY): Payer: Self-pay

## 2019-04-06 ENCOUNTER — Emergency Department (HOSPITAL_COMMUNITY)
Admission: EM | Admit: 2019-04-06 | Discharge: 2019-04-06 | Disposition: A | Discharge status: Home / Self Care | Payer: Self-pay | Attending: Emergency Medicine | Admitting: Emergency Medicine

## 2019-04-06 ENCOUNTER — Other Ambulatory Visit: Payer: Self-pay

## 2019-04-06 DIAGNOSIS — Z79899 Other long term (current) drug therapy: Secondary | ICD-10-CM | POA: Diagnosis not present

## 2019-04-06 DIAGNOSIS — Y92410 Unspecified street and highway as the place of occurrence of the external cause: Secondary | ICD-10-CM | POA: Diagnosis not present

## 2019-04-06 DIAGNOSIS — Y9389 Activity, other specified: Secondary | ICD-10-CM | POA: Insufficient documentation

## 2019-04-06 DIAGNOSIS — S199XXA Unspecified injury of neck, initial encounter: Secondary | ICD-10-CM | POA: Diagnosis present

## 2019-04-06 DIAGNOSIS — S8002XA Contusion of left knee, initial encounter: Secondary | ICD-10-CM | POA: Diagnosis not present

## 2019-04-06 DIAGNOSIS — Z85820 Personal history of malignant melanoma of skin: Secondary | ICD-10-CM | POA: Insufficient documentation

## 2019-04-06 DIAGNOSIS — R0789 Other chest pain: Secondary | ICD-10-CM | POA: Insufficient documentation

## 2019-04-06 DIAGNOSIS — F17210 Nicotine dependence, cigarettes, uncomplicated: Secondary | ICD-10-CM | POA: Insufficient documentation

## 2019-04-06 DIAGNOSIS — S161XXA Strain of muscle, fascia and tendon at neck level, initial encounter: Secondary | ICD-10-CM | POA: Insufficient documentation

## 2019-04-06 DIAGNOSIS — Y999 Unspecified external cause status: Secondary | ICD-10-CM | POA: Insufficient documentation

## 2019-04-06 DIAGNOSIS — S0990XA Unspecified injury of head, initial encounter: Secondary | ICD-10-CM | POA: Insufficient documentation

## 2019-04-06 MED ORDER — CYCLOBENZAPRINE HCL 10 MG PO TABS: 10.0000 mg | ORAL_TABLET | Freq: Two times a day (BID) | ORAL | 0 refills | Status: DC | PRN

## 2019-04-06 MED ORDER — IBUPROFEN 600 MG PO TABS: 600.0000 mg | ORAL_TABLET | Freq: Four times a day (QID) | ORAL | 0 refills | Status: DC | PRN

## 2019-04-06 MED ORDER — IBUPROFEN 800 MG PO TABS
800.0000 mg | ORAL_TABLET | Freq: Once | ORAL | Status: AC
  Administered 2019-04-06: 800 mg via ORAL
  Filled 2019-04-06: qty 1

## 2019-04-06 NOTE — Discharge Instructions (Signed)
Xrays are normal Ibuprofen every 8 hours as needed for pain Flexeril for muscle spasm Cold compresses to neck / knee ER for worsening symptoms.
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
## 2019-07-26 IMAGING — US US PELV - US TRANSVAGINAL
1 series · 13 of 25 positions shown · non-contrast
Comparison: None.

CLINICAL DATA: Pelvic pain, left lower quadrant

EXAM:
TRANSABDOMINAL AND TRANSVAGINAL ULTRASOUND OF PELVIS
DOPPLER ULTRASOUND OF OVARIES
TECHNIQUE: Both transabdominal and transvaginal ultrasound examinations of the
pelvis were performed. Transabdominal technique was performed for
global imaging of the pelvis including uterus, ovaries, adnexal
regions, and pelvic cul-de-sac.
It was necessary to proceed with endovaginal exam following the
transabdominal exam to visualize the uterus and endometrium. Color
and duplex Doppler ultrasound was utilized to evaluate blood flow to
the ovaries.

[Series 1: us pelv - us transvaginal · 0.25mm/px · 13 of 65 slices shown]
[im 1/65]
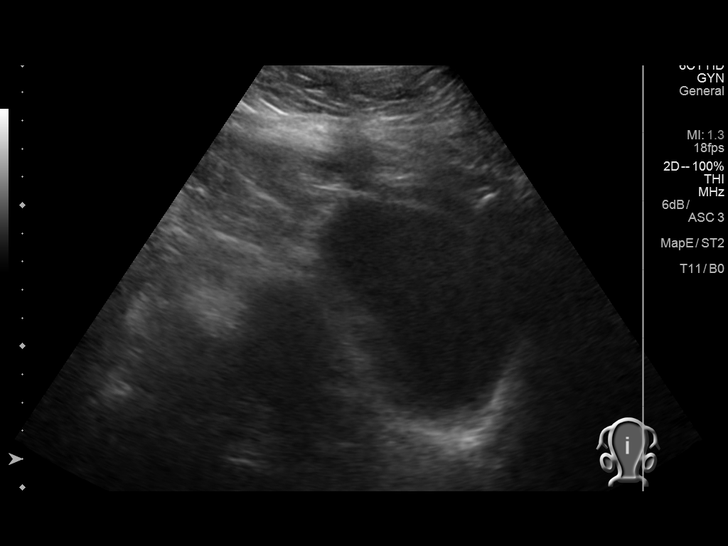
[im 6/65]
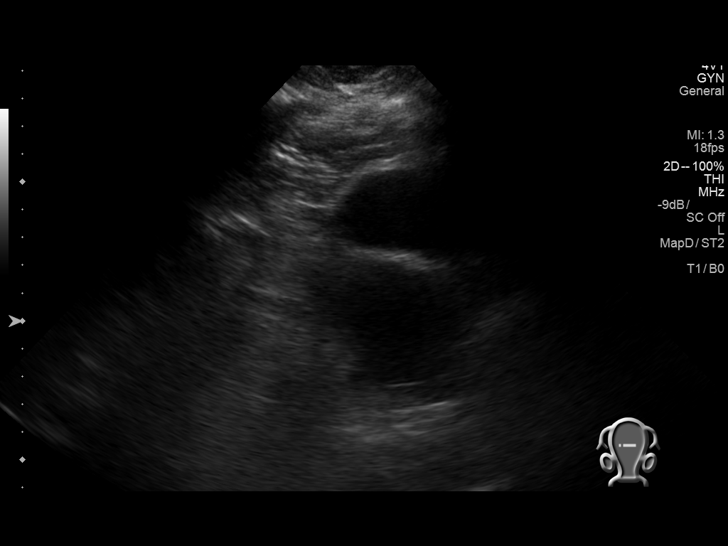
[im 11/65]
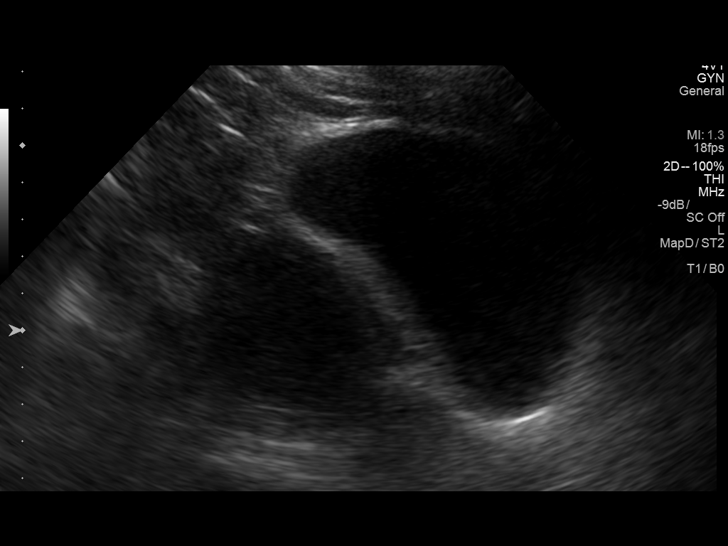
[im 17/65]
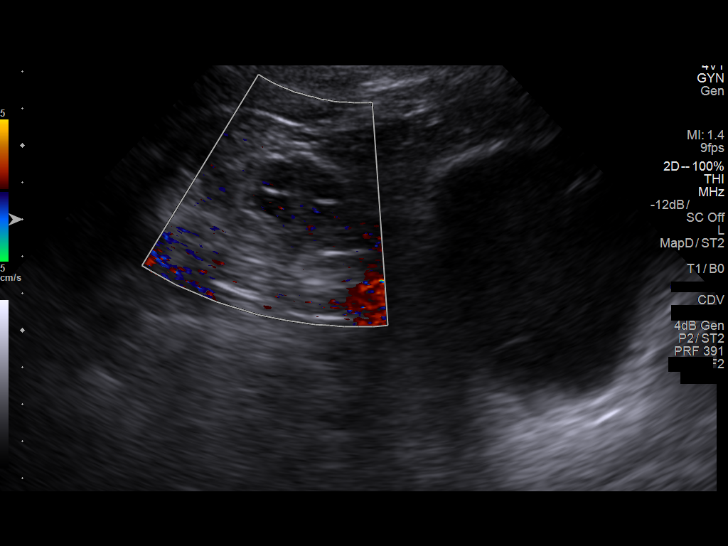
[im 22/65]
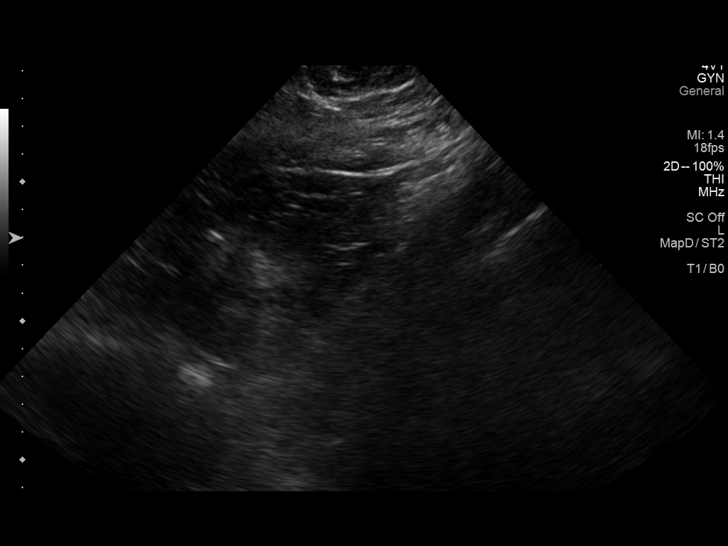
[im 27/65]
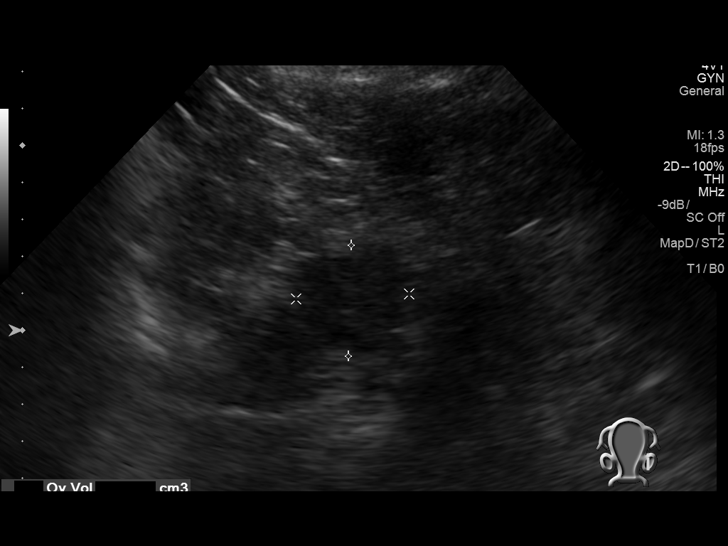
[im 33/65]
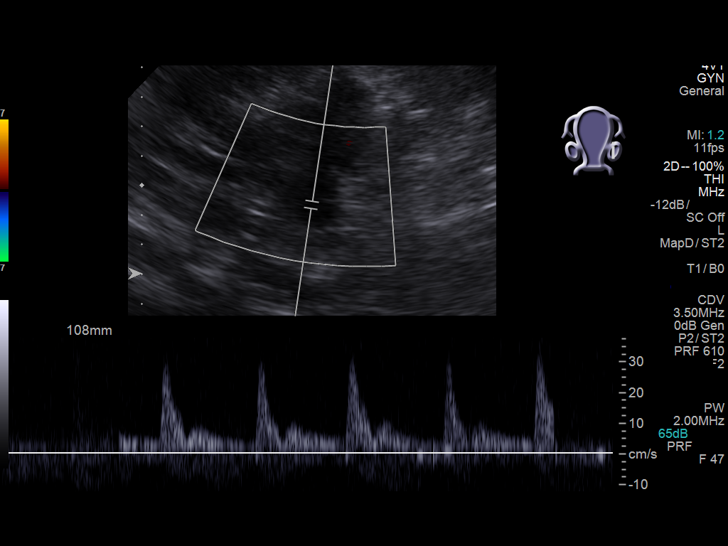
[im 38/65]
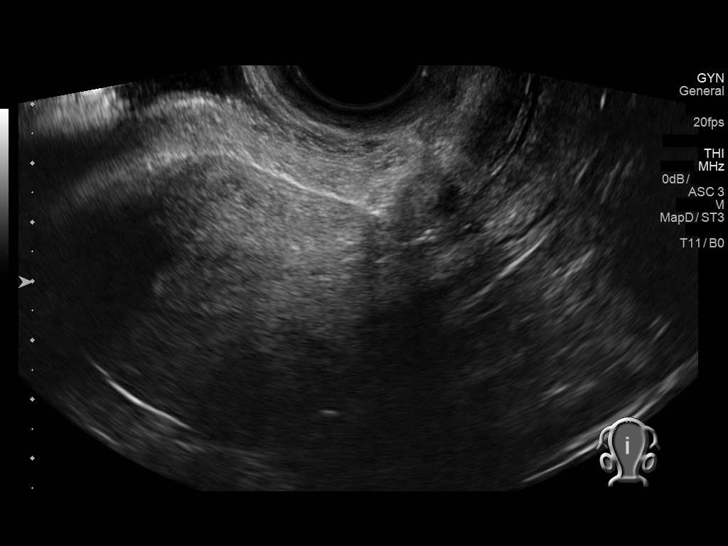
[im 43/65]
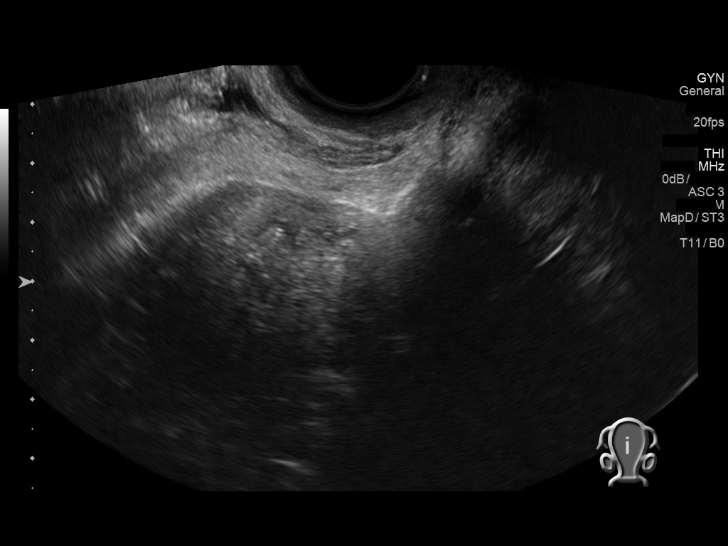
[im 49/65]
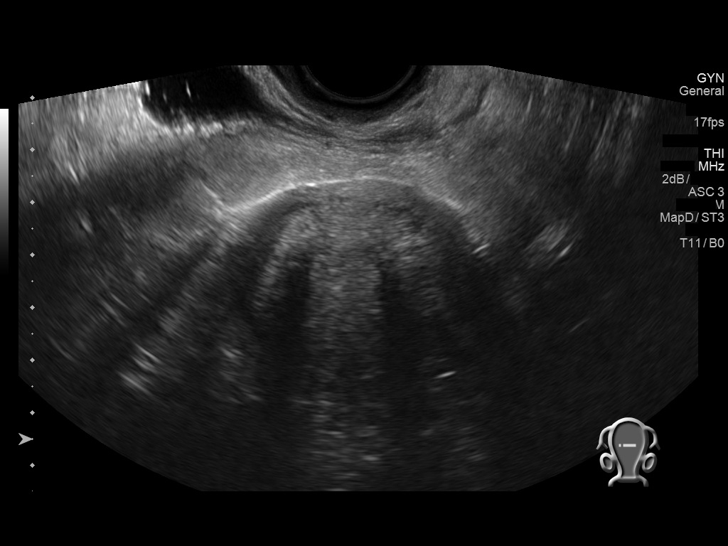
[im 54/65]
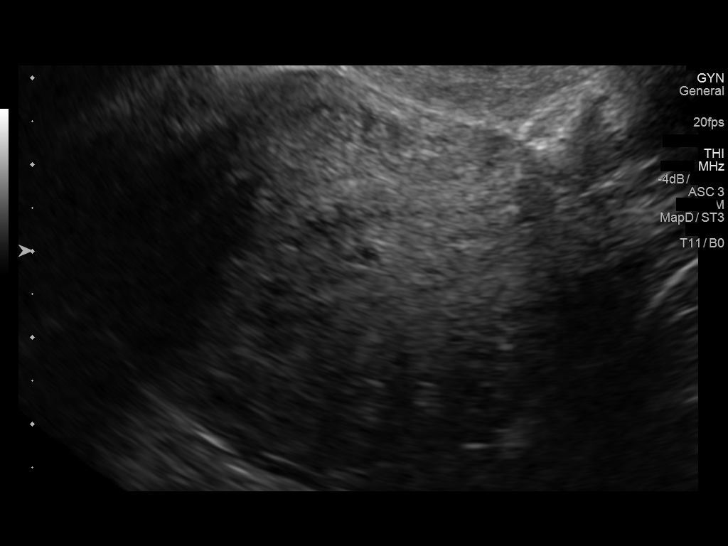
[im 59/65]
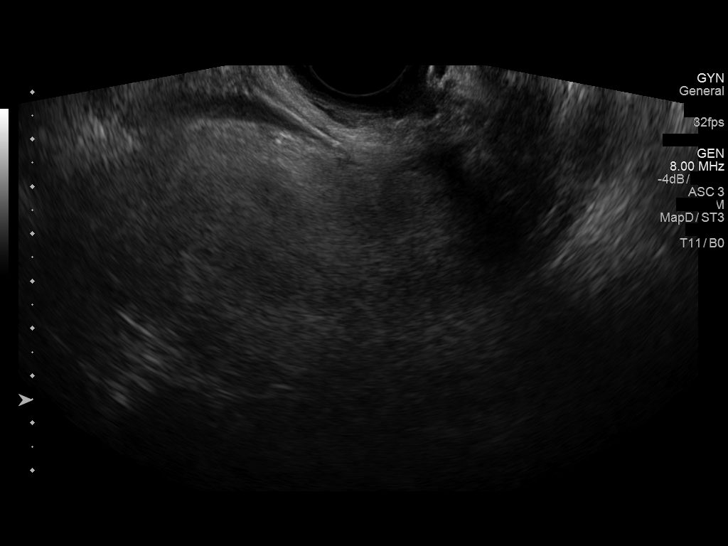
[im 65/65]
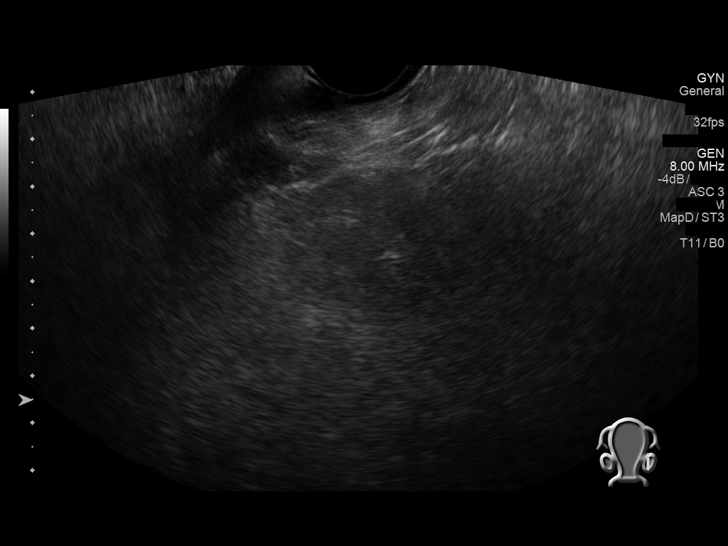

[13 of 25 positions shown; findings below may reference images not displayed]

FINDINGS: Uterus

Measurements: 9.1 x 5.0 x 4.7 cm. No fibroids or other mass
visualized.

Endometrium

Thickness: 11 mm in thickness.  No focal abnormality visualized.

Right ovary

Measurements: 3.2 x 2.6 x 2.8 cm. Normal appearance/no adnexal mass.

Left ovary

Measurements: 3.1 x 3.0 x 3.2 cm. Normal appearance/no adnexal mass.

Other findings

No abnormal free fluid.

Pulsed Doppler evaluation of both ovaries demonstrates normal
low-resistance arterial and venous waveforms.
IMPRESSION: Unremarkable pelvic ultrasound.  No evidence of ovarian torsion.

## 2020-05-06 ENCOUNTER — Encounter (HOSPITAL_COMMUNITY): Payer: Self-pay

## 2020-05-06 ENCOUNTER — Emergency Department (HOSPITAL_COMMUNITY): Payer: Managed Care, Other (non HMO)

## 2020-05-06 ENCOUNTER — Other Ambulatory Visit: Payer: Self-pay

## 2020-05-06 ENCOUNTER — Emergency Department (HOSPITAL_COMMUNITY)
Admission: EM | Admit: 2020-05-06 | Discharge: 2020-05-06 | Disposition: A | Discharge status: Home / Self Care | Payer: Managed Care, Other (non HMO) | Attending: Emergency Medicine | Admitting: Emergency Medicine

## 2020-05-06 DIAGNOSIS — F17210 Nicotine dependence, cigarettes, uncomplicated: Secondary | ICD-10-CM | POA: Insufficient documentation

## 2020-05-06 DIAGNOSIS — K219 Gastro-esophageal reflux disease without esophagitis: Secondary | ICD-10-CM | POA: Insufficient documentation

## 2020-05-06 DIAGNOSIS — N390 Urinary tract infection, site not specified: Secondary | ICD-10-CM

## 2020-05-06 DIAGNOSIS — Z955 Presence of coronary angioplasty implant and graft: Secondary | ICD-10-CM | POA: Diagnosis not present

## 2020-05-06 DIAGNOSIS — N189 Chronic kidney disease, unspecified: Secondary | ICD-10-CM | POA: Diagnosis not present

## 2020-05-06 DIAGNOSIS — R109 Unspecified abdominal pain: Secondary | ICD-10-CM

## 2020-05-06 DIAGNOSIS — R1032 Left lower quadrant pain: Secondary | ICD-10-CM | POA: Diagnosis present

## 2020-05-06 DIAGNOSIS — Z85828 Personal history of other malignant neoplasm of skin: Secondary | ICD-10-CM | POA: Diagnosis not present

## 2020-05-06 DIAGNOSIS — N3091 Cystitis, unspecified with hematuria: Secondary | ICD-10-CM | POA: Diagnosis not present

## 2020-05-06 DIAGNOSIS — R319 Hematuria, unspecified: Secondary | ICD-10-CM

## 2020-05-06 LAB — COMPREHENSIVE METABOLIC PANEL
ALT: 13 U/L (ref 0–44)
AST: 12 U/L — ABNORMAL LOW (ref 15–41)
Albumin: 4.1 g/dL (ref 3.5–5.0)
Alkaline Phosphatase: 67 U/L (ref 38–126)
Anion gap: 8 (ref 5–15)
BUN: 13 mg/dL (ref 6–20)
CO2: 23 mmol/L (ref 22–32)
Calcium: 8.8 mg/dL — ABNORMAL LOW (ref 8.9–10.3)
Chloride: 106 mmol/L (ref 98–111)
Creatinine, Ser: 0.64 mg/dL (ref 0.44–1.00)
GFR, Estimated: >60 mL/min (ref >60)
Glucose, Bld: 105 mg/dL — ABNORMAL HIGH (ref 70–99)
Potassium: 4.3 mmol/L (ref 3.5–5.1)
Sodium: 137 mmol/L (ref 135–145)
Total Bilirubin: 0.6 mg/dL (ref 0.3–1.2)
Total Protein: 7.2 g/dL (ref 6.5–8.1)

## 2020-05-06 LAB — URINALYSIS, ROUTINE W REFLEX MICROSCOPIC
Bilirubin Urine: NEGATIVE
Glucose, UA: NEGATIVE mg/dL
Ketones, ur: NEGATIVE mg/dL
Nitrite: POSITIVE — AB
Specific Gravity, Urine: 1.02 (ref 1.005–1.030)
pH: 7.5 (ref 5.0–8.0)

## 2020-05-06 LAB — CBC WITH DIFFERENTIAL/PLATELET
Abs Immature Granulocytes: 0.06 10*3/uL (ref 0.00–0.07)
Basophils Absolute: 0.1 10*3/uL (ref 0.0–0.1)
Basophils Relative: 1 %
Eosinophils Absolute: 0.3 10*3/uL (ref 0.0–0.5)
Eosinophils Relative: 2 %
HCT: 47.2 % — ABNORMAL HIGH (ref 36.0–46.0)
Hemoglobin: 15.9 g/dL — ABNORMAL HIGH (ref 12.0–15.0)
Immature Granulocytes: 1 %
Lymphocytes Relative: 16 %
Lymphs Abs: 1.9 10*3/uL (ref 0.7–4.0)
MCH: 31.5 pg (ref 26.0–34.0)
MCHC: 33.7 g/dL (ref 30.0–36.0)
MCV: 93.7 fL (ref 80.0–100.0)
Monocytes Absolute: 0.8 10*3/uL (ref 0.1–1.0)
Monocytes Relative: 7 %
Neutro Abs: 8.4 10*3/uL — ABNORMAL HIGH (ref 1.7–7.7)
Neutrophils Relative %: 73 %
Platelets: 278 10*3/uL (ref 150–400)
RBC: 5.04 MIL/uL (ref 3.87–5.11)
RDW: 12.7 % (ref 11.5–15.5)
WBC: 11.4 10*3/uL — ABNORMAL HIGH (ref 4.0–10.5)
nRBC: 0 % (ref 0.0–0.2)

## 2020-05-06 LAB — URINALYSIS, MICROSCOPIC (REFLEX): RBC / HPF: >50 RBC/hpf (ref 0–5)

## 2020-05-06 LAB — POC URINE PREG, ED: Preg Test, Ur: NEGATIVE

## 2020-05-06 LAB — LIPASE, BLOOD: Lipase: 29 U/L (ref 11–51)

## 2020-05-06 MED ORDER — CEPHALEXIN 500 MG PO CAPS: 500.0000 mg | ORAL_CAPSULE | Freq: Three times a day (TID) | ORAL | 0 refills | Status: AC

## 2020-05-06 MED ORDER — MORPHINE SULFATE (PF) 4 MG/ML IV SOLN
4.0000 mg | Freq: Once | INTRAVENOUS | Status: AC
  Administered 2020-05-06: 4 mg via INTRAVENOUS
  Filled 2020-05-06: qty 1

## 2020-05-06 MED ORDER — OXYCODONE HCL 5 MG PO TABS: 5.0000 mg | ORAL_TABLET | ORAL | 0 refills | Status: AC | PRN

## 2020-05-06 MED ORDER — ONDANSETRON HCL 4 MG PO TABS: 4.0000 mg | ORAL_TABLET | Freq: Four times a day (QID) | ORAL | 0 refills | Status: AC | PRN

## 2020-05-06 MED ORDER — KETOROLAC TROMETHAMINE 30 MG/ML IJ SOLN
30.0000 mg | Freq: Once | INTRAMUSCULAR | Status: AC
  Administered 2020-05-06: 30 mg via INTRAVENOUS
  Filled 2020-05-06: qty 1

## 2020-05-06 MED ORDER — SODIUM CHLORIDE 0.9 % IV SOLN
2.0000 g | Freq: Once | INTRAVENOUS | Status: AC
  Administered 2020-05-06: 2 g via INTRAVENOUS
  Filled 2020-05-06: qty 20

## 2020-05-06 MED ORDER — ONDANSETRON HCL 4 MG/2ML IJ SOLN
4.0000 mg | Freq: Once | INTRAMUSCULAR | Status: AC
  Administered 2020-05-06: 4 mg via INTRAVENOUS
  Filled 2020-05-06: qty 2

## 2020-05-06 NOTE — ED Provider Notes (Signed)
[facility] Provider Note   CSN: [id number] Arrival date & time: 05/06/20  7673   History Chief complaint: Left flank pain  Crystal Zamora is a 35 y.o. female.  The history is provided by the patient.  She has history of kidney stones and comes in complaining of pain in the left flank with radiation to the left lower quadrant.  Pain started yesterday and got worse last night.  She rates pain at 6/10.  There is associated nausea with dry heaves but no vomiting.  She denies fever, chills, sweats.  She did notice some blood in her urine.  She has took acetaminophen for pain which was giving her some relief yesterday, but not since the pain started to get worse.  Heating pad also seemed to help initially, but is not helping now.  Past Medical History:  Diagnosis Date  . Cancer ([place])    skin melanoma  ,  6 removed stomach and back  . Chronic kidney disease    stones   . Endometriosis   . Endometriosis   . GERD (gastroesophageal reflux disease)     Patient Active Problem List   Diagnosis Date Noted  . Acute sinusitis 03/11/2014  . Blurry vision, right eye 03/11/2014  . Shingles rash 03/11/2014    Past Surgical History:  Procedure Laterality Date  . CESAREAN SECTION    . CHOLECYSTECTOMY N/A 02/24/2013   Procedure: LAPAROSCOPIC CHOLECYSTECTOMY;  Surgeon: Jamesetta So, MD;  Location: [facility];  Service: General;  Laterality: N/A;  . CYSTOSCOPY/URETEROSCOPY/HOLMIUM LASER/STENT PLACEMENT Left 02/25/2015   Procedure: CYSTOSCOPY LEFT RETROGRADE PYELOGRAM, HOLMIUM LASER APPLICATION WITH STONE BASKETRY AND LEFT URETERAL STENT PLACEMENT ;  Surgeon: Rana Snare, MD;  Location: [facility];  Service: Urology;  Laterality: Left;  . laporscopic     r/o endometriosis     OB History    Gravida  4   Para  1   Term  1   Preterm      AB  2   Living        SAB  2   IAB      Ectopic      Multiple      Live Births              Family History  Problem  Relation Age of Onset  . Heart failure Father     Social History   Tobacco Use  . Smoking status: Current Every Day Smoker    Packs/day: 0.50    Types: Cigarettes  . Smokeless tobacco: Never Used  Substance Use Topics  . Alcohol use: No  . Drug use: No    Home Medications Prior to Admission medications   Medication Sig Start Date End Date Taking? Authorizing Provider  acetaminophen (TYLENOL) 500 MG tablet Take 500 mg by mouth every 6 (six) hours as needed for mild pain.    [provider]  cyclobenzaprine (FLEXERIL) 10 MG tablet Take 1 tablet (10 mg total) by mouth 2 (two) times daily as needed for muscle spasms. 04/06/19   Noemi Chapel, MD  fluticasone [facility]) 50 MCG/ACT nasal spray Place 1 spray into both nostrils daily as needed for allergies or rhinitis.    [provider]  HYDROcodone-acetaminophen (NORCO) 5-325 MG tablet Take 1 tablet by mouth every 4 (four) hours as needed for moderate pain. 05/15/35   Delora Fuel, MD  ibuprofen (ADVIL) 600 MG tablet Take 1 tablet (600 mg total) by mouth every 6 (six)  hours as needed. 04/06/19   Noemi Chapel, MD  Multiple Vitamins-Calcium (ONE-A-DAY WOMENS FORMULA PO) Take 1 tablet by mouth daily.    [provider]  naproxen (NAPROSYN) 500 MG tablet Take 1 tablet (500 mg total) by mouth 2 (two) times daily. 02/03/30   Delora Fuel, MD  orphenadrine (NORFLEX) 100 MG tablet Take 1 tablet (100 mg total) by mouth 2 (two) times daily. 3/55/73   Delora Fuel, MD    Allergies    Patient has no known allergies.  Review of Systems   Review of Systems  All other systems reviewed and are negative.   Physical Exam Updated Vital Signs BP (!) 152/98   Pulse 91   Temp 98.7 F (37.1 C) (Oral)   Resp 20   Ht 5\' 6"  (1.676 m)   Wt 90.7 kg   LMP 04/21/2020   SpO2 97%   BMI 32.28 kg/m   Physical Exam Vitals and nursing note reviewed.   35 year old female, resting comfortably and in no acute distress. Vital signs  are significant for elevated blood pressure. Oxygen saturation is 97%, which is normal. Head is normocephalic and atraumatic. PERRLA, EOMI. Oropharynx is clear. Neck is nontender and supple without adenopathy or JVD. Back is nontender and there is no CVA tenderness. Lungs are clear without rales, wheezes, or rhonchi. Chest is nontender. Heart has regular rate and rhythm without murmur. Abdomen is soft, flat, with mild left lower quadrant tenderness.  There is no rebound or guarding.  There are no masses or hepatosplenomegaly and peristalsis is hypoactive. Extremities have no cyanosis or edema, full range of motion is present. Skin is warm and dry without rash. Neurologic: Mental status is normal, cranial nerves are intact, moves all extremities equally.  ED Results / Procedures / Treatments   Labs (all labs ordered are listed, but only abnormal results are displayed) Labs Reviewed  URINALYSIS, ROUTINE W REFLEX MICROSCOPIC - Abnormal; Notable for the following components:      Result Value   Hgb urine dipstick LARGE (*)    Protein, ur TRACE (*)    Nitrite POSITIVE (*)    Leukocytes,Ua SMALL (*)    All other components within normal limits  URINALYSIS, MICROSCOPIC (REFLEX) - Abnormal; Notable for the following components:   Bacteria, UA MANY (*)    All other components within normal limits  URINE CULTURE  COMPREHENSIVE METABOLIC PANEL  CBC WITH DIFFERENTIAL/PLATELET  LIPASE, BLOOD  POC URINE PREG, ED   Radiology CT Renal Stone Study  Result Date: 05/06/2020 CLINICAL DATA:  35 year old female with left flank pain and gross hematuria onset this morning. EXAM: CT ABDOMEN AND PELVIS WITHOUT CONTRAST TECHNIQUE: Multidetector CT imaging of the abdomen and pelvis was performed following the standard protocol without IV contrast. COMPARISON:  Noncontrast CT Abdomen and Pelvis 02/04/2018. Report of [place] [facility] IV contrast enhanced CT Abdomen and Pelvis 08/26/2015 (no images  available). FINDINGS: Lower chest: Stable, negative. Hepatobiliary: Chronically absent gallbladder. Stable and negative noncontrast liver. Pancreas: Negative. Spleen: Negative. Adrenals/Urinary Tract: Negative adrenal glands. Noncontrast kidneys appear stable and within normal limits; benign chronic left renal upper pole cyst. No nephrolithiasis. No hydronephrosis. Decompressed ureters and bladder. No calculus identified within the bladder. Chronic pelvic phleboliths. Stomach/Bowel: Negative. Normal appendix (series 2, image 67). No free air, free fluid. Vascular/Lymphatic: Normal caliber abdominal aorta. No calcified atherosclerosis identified. No lymphadenopathy. Reproductive: Chronic pelvic phleboliths. Negative noncontrast uterus and ovaries. Other: No pelvic free fluid. Musculoskeletal: No acute osseous abnormality identified. IMPRESSION:  Negative noncontrast CT Abdomen and Pelvis. No urinary calculus or obstructive uropathy. Electronically Signed   By: Genevie Ann M.D.   On: 05/06/2020 06:36    Procedures Procedures   Medications Ordered in ED Medications  cefTRIAXone (ROCEPHIN) 2 g in sodium chloride 0.9 % 100 mL IVPB (has no administration in time range)  ondansetron (ZOFRAN) injection 4 mg (4 mg Intravenous Given 05/06/20 0555)  ketorolac (TORADOL) 30 MG/ML injection 30 mg (30 mg Intravenous Given 05/06/20 0554)    ED Course  I have reviewed the triage vital signs and the nursing notes.  Pertinent labs & imaging results that were available during my care of the patient were reviewed by me and considered in my medical decision making (see chart for details).  MDM Rules/Calculators/A&P Left flank pain, consider kidney stones, pyelonephritis, diverticulitis.  Old records are reviewed, and she has had several CT scans of abdomen and pelvis, none of which have shown any evidence of renal calculi.  Will check screening labs, urinalysis and repeat renal stone protocol CT scan.  She is given  ondansetron for nausea and ketorolac for pain.  CT scan showed no evidence of urolithiasis or nephrolithiasis.  Urinalysis suggest UTI with positive nitrite, greater than 50 RBCs, many bacteria although only 0-5 WBCs noted.  Urine is sent for culture and she is given a dose of ceftriaxone.  She did not get good pain relief with ketorolac, and is given a dose of morphine.    Labs are unremarkable.  She is discharged with prescription for ceftriaxone and ondansetron as well as a small number of oxycodone tablets.  Return precautions discussed.  Final Clinical Impression(s) / ED Diagnoses Final diagnoses:  Left flank pain  Urinary tract infection with hematuria, site unspecified    Rx / DC Orders ED Discharge Orders    None       Delora Fuel, MD 62/37/62 [phone]

## 2020-05-06 NOTE — ED Triage Notes (Signed)
Left flank pain and blood in urine that started this am

## 2020-05-06 NOTE — Discharge Instructions (Signed)
Your CT scan did not show signs of kidney stone, but your urine shows signs of infection.  I believe that is what is causing your pain.  Take the antibiotics as prescribed.  You may take ibuprofen or acetaminophen as needed for pain, reserve oxycodone for more severe pain.  Return to the emergency department if you start running a high fever, you have vomiting which is not able to be controlled with the ondansetron which has been prescribed, or pain is not being adequately controlled at home.

## 2020-05-09 LAB — URINE CULTURE: Culture: >=100000 — AB

## 2020-05-10 ENCOUNTER — Telehealth: Payer: Self-pay | Admitting: Emergency Medicine

## 2020-05-10 NOTE — Telephone Encounter (Signed)
Post ED Visit - Positive Culture Follow-up  Culture report reviewed by antimicrobial stewardship pharmacist: [place] Team []  Elenor Quinones, Pharm.D. []  Heide Guile, Pharm.D., BCPS AQ-ID []  Parks Neptune, Pharm.D., BCPS []  Alycia Rossetti, Pharm.D., BCPS []  [place], [place].D., BCPS, AAHIVP []  Legrand Como, Pharm.D., BCPS, AAHIVP []  Salome Arnt, PharmD, BCPS []  Johnnette Gourd, PharmD, BCPS []  Hughes Better, PharmD, BCPS [x]  Shauna Hugh, PharmD []  Laqueta Linden, PharmD, BCPS []  Albertina Parr, PharmD  [place] Team []  Leodis Sias, PharmD []  Lindell Spar, PharmD []  Royetta Asal, PharmD []  Graylin Shiver, Rph []  Rema Fendt) Glennon Mac, PharmD []  Arlyn Dunning, PharmD []  Netta Cedars, PharmD []  Dia Sitter, PharmD []  Leone Haven, PharmD []  Gretta Arab, PharmD []  Theodis Shove, PharmD []  Peggyann Juba, PharmD []  Reuel Boom, PharmD   Positive urine culture Treated with Cephalexin, organism sensitive to the same and no further patient follow-up is required at this time.  Milus Mallick 05/10/2020, 3:42 PM
